# Patient Record
Sex: Female | Born: 1970 | Race: Black or African American | Hispanic: No | Marital: Married | Smoking: Never smoker
Health system: Southern US, Community
[De-identification: ages and names within clinical notes are randomized; demographics above are authoritative.]

## PROBLEM LIST (undated history)

## (undated) DIAGNOSIS — U071 COVID-19: Secondary | ICD-10-CM

## (undated) DIAGNOSIS — Z8249 Family history of ischemic heart disease and other diseases of the circulatory system: Secondary | ICD-10-CM

## (undated) DIAGNOSIS — R51 Headache: Secondary | ICD-10-CM

## (undated) DIAGNOSIS — R519 Headache, unspecified: Secondary | ICD-10-CM

## (undated) DIAGNOSIS — I10 Essential (primary) hypertension: Secondary | ICD-10-CM

## (undated) DIAGNOSIS — R002 Palpitations: Secondary | ICD-10-CM

## (undated) HISTORY — DX: Headache: R51

## (undated) HISTORY — PX: OTHER SURGICAL HISTORY: SHX169

## (undated) HISTORY — DX: Family history of ischemic heart disease and other diseases of the circulatory system: Z82.49

## (undated) HISTORY — DX: Palpitations: R00.2

## (undated) HISTORY — DX: Headache, unspecified: R51.9

---

## 2005-04-24 ENCOUNTER — Ambulatory Visit: Payer: Self-pay

## 2007-02-11 ENCOUNTER — Emergency Department: Payer: Self-pay | Admitting: Emergency Medicine

## 2007-02-26 ENCOUNTER — Emergency Department: Payer: Self-pay

## 2008-02-22 ENCOUNTER — Encounter: Payer: Self-pay | Admitting: Obstetrics and Gynecology

## 2008-02-22 ENCOUNTER — Ambulatory Visit: Payer: Self-pay | Admitting: Obstetrics and Gynecology

## 2008-03-14 ENCOUNTER — Ambulatory Visit: Payer: Self-pay | Admitting: Obstetrics and Gynecology

## 2010-03-01 ENCOUNTER — Ambulatory Visit: Payer: Self-pay | Admitting: Otolaryngology

## 2010-03-08 ENCOUNTER — Other Ambulatory Visit: Payer: Self-pay | Admitting: Otolaryngology

## 2010-05-10 ENCOUNTER — Ambulatory Visit: Payer: Self-pay | Admitting: Internal Medicine

## 2010-09-04 ENCOUNTER — Ambulatory Visit: Payer: Self-pay | Admitting: Family Medicine

## 2010-12-10 NOTE — Assessment & Plan Note (Signed)
Tara Newman, Tara Newman             ACCOUNT NO.:  [id number]   MEDICAL RECORD NO.:  [id number]          PATIENT TYPE:  POB   LOCATION:  [facility]         FACILITY:  [facility]   PHYSICIAN:  Argentina Donovan, MD        DATE OF BIRTH:  October 05, 1970   DATE OF SERVICE:  02/22/2008                                  CLINIC NOTE   The patient is a 40 year old African American female gravida 2, para 1-0-  1-1, child aged 55 years old, who had regular periods up until April  where she missed a period in April-May, had some spotting in June and  then had a heavy bleed lasting 2 weeks, and she was hemorrhaging in  July.  She went to Urgent Care, where they did a CMET on her and treated  her for hypertension.  She was put on hydrochlorothiazide, and today  when she comes in she has a blood pressure of 138/93; we will follow  that up.  She says she gained a lot of weight recently.  She weighs 269  pounds and is 5 feet 3 inches tall, and she also said that she had had  this clear, abundant, egg-white-type vaginal discharge.  We sat down  with her, and we discussed obesity and estrogen produced in the  peripheral tissues in obese women.  We talked about polycystic ovarian  syndrome, which apparently her daughter and 2 of her sisters have.  She  has no sign of hirsutism, but she does have at the present time the  onset of oligomenorrhea.  We did a Pap smear, and we are getting some  laboratory tests on her and have her come back in 2 weeks.  I have  explained to her what the problem is, and I am going to give her a  prescription for Provera to take if she goes more than 45 days without a  period.   On examination, the abdomen is soft, flat, and nontender; no masses, no  organomegaly.  No sign of female escutcheon.  External genitalia is  normal.  BUS is within normal limits.  Vagina is clean and well rugated.  Cervix clean and parous.  The uterus and adnexa could not be well  outlined because of habitus  of the patient.   IMPRESSION:  Oligomenorrhea, probably due to increased weight gain.           ______________________________  Argentina Donovan, MD     PR/MEDQ  D:  02/22/2008  T:  02/23/2008  Job:  [id number]

## 2011-03-09 ENCOUNTER — Emergency Department: Payer: Self-pay | Admitting: Internal Medicine

## 2011-09-10 ENCOUNTER — Ambulatory Visit: Payer: Self-pay | Admitting: Family Medicine

## 2013-07-04 ENCOUNTER — Ambulatory Visit: Payer: Self-pay

## 2014-12-13 ENCOUNTER — Emergency Department: Payer: PRIVATE HEALTH INSURANCE

## 2014-12-13 ENCOUNTER — Encounter: Payer: Self-pay | Admitting: Emergency Medicine

## 2014-12-13 ENCOUNTER — Emergency Department
Admission: EM | Admit: 2014-12-13 | Discharge: 2014-12-13 | Disposition: A | Discharge status: Home / Self Care | Payer: PRIVATE HEALTH INSURANCE | Attending: Student | Admitting: Student

## 2014-12-13 DIAGNOSIS — Y998 Other external cause status: Secondary | ICD-10-CM | POA: Insufficient documentation

## 2014-12-13 DIAGNOSIS — S4992XA Unspecified injury of left shoulder and upper arm, initial encounter: Secondary | ICD-10-CM | POA: Insufficient documentation

## 2014-12-13 DIAGNOSIS — I10 Essential (primary) hypertension: Secondary | ICD-10-CM | POA: Insufficient documentation

## 2014-12-13 DIAGNOSIS — S3992XA Unspecified injury of lower back, initial encounter: Secondary | ICD-10-CM | POA: Diagnosis present

## 2014-12-13 DIAGNOSIS — Y92410 Unspecified street and highway as the place of occurrence of the external cause: Secondary | ICD-10-CM | POA: Insufficient documentation

## 2014-12-13 DIAGNOSIS — S39012A Strain of muscle, fascia and tendon of lower back, initial encounter: Secondary | ICD-10-CM | POA: Insufficient documentation

## 2014-12-13 DIAGNOSIS — Z880 Allergy status to penicillin: Secondary | ICD-10-CM | POA: Insufficient documentation

## 2014-12-13 DIAGNOSIS — Y9389 Activity, other specified: Secondary | ICD-10-CM | POA: Insufficient documentation

## 2014-12-13 DIAGNOSIS — T148XXA Other injury of unspecified body region, initial encounter: Secondary | ICD-10-CM

## 2014-12-13 MED ORDER — KETOROLAC TROMETHAMINE 60 MG/2ML IM SOLN
60.0000 mg | Freq: Once | INTRAMUSCULAR | Status: AC
  Administered 2014-12-13: 60 mg via INTRAMUSCULAR

## 2014-12-13 MED ORDER — ORPHENADRINE CITRATE 30 MG/ML IJ SOLN
60.0000 mg | Freq: Two times a day (BID) | INTRAMUSCULAR | Status: DC
Start: 2014-12-13 — End: 2014-12-13
  Administered 2014-12-13: 60 mg via INTRAMUSCULAR

## 2014-12-13 MED ORDER — ORPHENADRINE CITRATE 30 MG/ML IJ SOLN
INTRAMUSCULAR | Status: AC
  Filled 2014-12-13: qty 2

## 2014-12-13 MED ORDER — IBUPROFEN 800 MG PO TABS
800.0000 mg | ORAL_TABLET | Freq: Three times a day (TID) | ORAL | Status: DC | PRN
Start: 2014-12-13 — End: 2016-08-25

## 2014-12-13 MED ORDER — CYCLOBENZAPRINE HCL 10 MG PO TABS: 10.0000 mg | ORAL_TABLET | Freq: Three times a day (TID) | ORAL | Status: DC | PRN

## 2014-12-13 MED ORDER — KETOROLAC TROMETHAMINE 60 MG/2ML IM SOLN
INTRAMUSCULAR | Status: AC
  Filled 2014-12-13: qty 2
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
## 2014-12-13 NOTE — Discharge Instructions (Signed)
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 Walk with an upright posture. Your ears, shoulders and hips should all line-up. PROLONGED ACTIVITY IN A FLEXED POSITION When completing a task that requires you to bend forward at your waist or lean over a low surface, try to find a way to stabilize 3 out of 4 of your limbs. You can place a hand or elbow on your thigh or rest a knee on the surface you are reaching across. This will provide you more stability, so that your muscles do not  tire as quickly. By keeping your knees relaxed, or slightly bent, you will also reduce stress across your lower back. CORRECT LIFTING TECHNIQUES DO :  Assume a wide stance. This will provide you more stability and the opportunity to get as close as possible to the object which you are lifting.  Tense your abdominals to brace your spine. Bend at the knees and hips. Keeping your back locked in a neutral-spine position, lift using your leg muscles. Lift with your legs, keeping your back straight.  Test the weight of unknown objects before attempting to  lift them.  Try to keep your elbows locked down at your sides in order get the best strength from your shoulders when carrying an object.  Always ask for help when lifting heavy or awkward objects. INCORRECT LIFTING TECHNIQUES DO NOT:   Lock your knees when lifting, even if it is a small object.  Bend and twist. Pivot at your feet or move your feet when needing to change directions.  Assume that you can safely pick up even a paperclip without proper posture. Document Released: 07/14/2005 Document Revised: 10/06/2011 Document Reviewed: 10/26/2008 [facility] Patient Information 2015 [employer], [place]. This information is not intended to replace advice given to you by your health care provider. Make sure you discuss any questions you have with your health care provider.
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
## 2016-08-19 ENCOUNTER — Emergency Department: Payer: PRIVATE HEALTH INSURANCE

## 2016-08-19 ENCOUNTER — Emergency Department
Admission: EM | Admit: 2016-08-19 | Discharge: 2016-08-20 | Disposition: A | Discharge status: Home / Self Care | Payer: PRIVATE HEALTH INSURANCE | Attending: Emergency Medicine | Admitting: Emergency Medicine

## 2016-08-19 DIAGNOSIS — Y939 Activity, unspecified: Secondary | ICD-10-CM | POA: Diagnosis not present

## 2016-08-19 DIAGNOSIS — Y929 Unspecified place or not applicable: Secondary | ICD-10-CM | POA: Diagnosis not present

## 2016-08-19 DIAGNOSIS — M549 Dorsalgia, unspecified: Secondary | ICD-10-CM

## 2016-08-19 DIAGNOSIS — S3992XA Unspecified injury of lower back, initial encounter: Secondary | ICD-10-CM | POA: Diagnosis present

## 2016-08-19 DIAGNOSIS — Y999 Unspecified external cause status: Secondary | ICD-10-CM | POA: Diagnosis not present

## 2016-08-19 DIAGNOSIS — R519 Headache, unspecified: Secondary | ICD-10-CM

## 2016-08-19 DIAGNOSIS — M542 Cervicalgia: Secondary | ICD-10-CM | POA: Insufficient documentation

## 2016-08-19 DIAGNOSIS — M5442 Lumbago with sciatica, left side: Secondary | ICD-10-CM | POA: Insufficient documentation

## 2016-08-19 DIAGNOSIS — I10 Essential (primary) hypertension: Secondary | ICD-10-CM | POA: Diagnosis not present

## 2016-08-19 DIAGNOSIS — W010XXA Fall on same level from slipping, tripping and stumbling without subsequent striking against object, initial encounter: Secondary | ICD-10-CM | POA: Diagnosis not present

## 2016-08-19 DIAGNOSIS — W009XXA Unspecified fall due to ice and snow, initial encounter: Secondary | ICD-10-CM

## 2016-08-19 DIAGNOSIS — R51 Headache: Secondary | ICD-10-CM

## 2016-08-19 LAB — POCT PREGNANCY, URINE: Preg Test, Ur: NEGATIVE

## 2016-08-19 MED ORDER — KETOROLAC TROMETHAMINE 10 MG PO TABS: 10.0000 mg | ORAL_TABLET | Freq: Three times a day (TID) | ORAL | 0 refills | Status: DC | PRN

## 2016-08-19 MED ORDER — KETOROLAC TROMETHAMINE 30 MG/ML IJ SOLN
60.0000 mg | Freq: Once | INTRAMUSCULAR | Status: AC
  Administered 2016-08-19: 60 mg via INTRAMUSCULAR
  Filled 2016-08-19: qty 2

## 2016-08-19 MED ORDER — OXYCODONE-ACETAMINOPHEN 5-325 MG PO TABS: 1.0000 | ORAL_TABLET | ORAL | 0 refills | Status: DC | PRN

## 2016-08-19 NOTE — ED Triage Notes (Signed)
Pt presents to ED with c/o neck and back pain d/t a slip and fall on ice yesterday. Pt unsure of head injury but reports head and neck pain on LEFT side; pt does deny any LOC. Pt denies any c/o numbness, weakness, or tingling in upper or lower extremities; pt denies any c/o loss of bowel or bladder control.

## 2016-08-19 NOTE — ED Provider Notes (Signed)
[facility] Emergency Department Provider Note  ____________________________________________  Time seen: Approximately 10:41 PM  I have reviewed the triage vital signs and the nursing notes.   HISTORY  Chief Complaint Fall; Neck Pain; and Back Pain    HPI Tara Newman is a 46 y.o. female with obesity presenting w/ L face, left lateral neck, mid and low back pain since slipping on ice yesterday at 6am.  Occasionally the patient gets radiation of the back pain into the left buttock and posterior thigh. No associated loss of consciousness. She was able to get up and ambulate immediately after the fall. She denies any headache or vomiting, visual changes,numbness tingling or weakness, saddle anesthesia, urinary or fecal incontinence or retention. Her pain was better with a warm shower. She has not tried any medications for pain.   History reviewed. No pertinent past medical history.  There are no active problems to display for this patient.   Past Surgical History:  Procedure Laterality Date  . broken ankle      Current Outpatient Rx  . Order #: [id number] Class: Print  . Order #: [id number] Class: Print  . Order #: [id number] Class: Print  . Order #: [id number] Class: Print    Allergies Penicillins  No family history on file.  Social History Social History  Substance Use Topics  . Smoking status: Never Smoker  . Smokeless tobacco: Never Used  . Alcohol use No    Review of Systems Constitutional: No fever/chills.Positive mechanical fall. Negative syncope. No loss of consciousness. Eyes: No visual changes. No blurred or double vision. ENT: No dental injury or malocclusion. No congestion or rhinorrhea. Positive for left face pain, mostly over the left zygomatic arch. Cardiovascular: Denies chest pain. Denies palpitations. Respiratory: Denies shortness of breath.  No cough. Gastrointestinal: No abdominal pain.  No nausea, no vomiting.  No diarrhea.   No constipation. Genitourinary: Negative for dysuria. Musculoskeletal: Positive for mid and lower back pain. Skin: Negative for rash. Neurological: Negative for headaches. No focal numbness, tingling or weakness. No difficulty walking.  10-point ROS otherwise negative.  ____________________________________________   PHYSICAL EXAM:  VITAL SIGNS: ED Triage Vitals  Enc Vitals Group     BP 08/19/16 1940 (!) 215/115     Pulse Rate 08/19/16 1940 83     Resp 08/19/16 1940 18     Temp 08/19/16 1940 98.2 F (36.8 C)     Temp Source 08/19/16 1940 Oral     SpO2 08/19/16 1940 100 %     Weight 08/19/16 1937 270 lb (122.5 kg)     Height 08/19/16 1937 5\' 4"  (1.626 m)     Head Circumference --      Peak Flow --      Pain Score 08/19/16 1937 8     Pain Loc --      Pain Edu? --      Excl. in GC? --     Constitutional: Alert and oriented. Well appearing and in no acute distress. Answers questions appropriately. Eyes: Conjunctivae are normal.  EOMI. No scleral icterus.No raccoon eyes. Head: Atraumatic. No Battle sign. No evidence of swelling or ecchymosis on the face. Nose: No congestion/rhinnorhea. No swelling over the nose or septal hematoma. Mouth/Throat: Mucous membranes are moist. No dental injury or malocclusion. Patient is able to open and close her mouth without any clicking or other abnormalities in the jaw. EARS: No hemotympanum in the left ear. Neck: No stridor.  Supple.  No midline C-spine tenderness to palpation, step-offs or  deformities. The patient does have some tenderness on the left lateral neck. Cardiovascular: Normal rate, Respiratory: Normal respiratory effort.  Musculoskeletal: Diffuse tenderness to palpation in the midline from the mid thoracic spine to the sacrum without focality. Neurologic:  A&Ox3.  Speech is clear.  Face and smile are symmetric.  EOMI.  Moves all extremities well. nromal gait without ataxia. Skin:  Skin is warm, dry and intact. No rash  noted. Psychiatric: Mood and affect are normal. Speech and behavior are normal.  Normal judgement.  ____________________________________________   LABS (all labs ordered are listed, but only abnormal results are displayed)  Labs Reviewed - No data to display ____________________________________________  EKG  Not indicated ____________________________________________  RADIOLOGY  No results found.  ____________________________________________   PROCEDURES  Procedure(s) performed: None  Procedures  Critical Care performed: No ____________________________________________   INITIAL IMPRESSION / ASSESSMENT AND PLAN / ED COURSE  Pertinent labs & imaging results that were available during my care of the patient were reviewed by me and considered in my medical decision making (see chart for details).  46 y.o. female with morbid obesity status post mechanical fall yesterday morning presenting with left face pain without any obvious injury, diffuse back pain that radiates to the left buttock consistent with contusion and possibly sciatica. There are no clinical symptoms that would be suggestive of spinal cord compression at this time. I will plan to treat the patient's pain with Toradol, x-ray her T and L-spine, and it is a discharge home with symptomatic treatment.  ____________________________________________  FINAL CLINICAL IMPRESSION(S) / ED DIAGNOSES  Final diagnoses:  Mid back pain  Acute midline low back pain with left-sided sciatica  Left-sided face pain  Neck pain on left side  Fall due to slipping on ice or snow, initial encounter         NEW MEDICATIONS STARTED DURING THIS VISIT:  New Prescriptions   KETOROLAC (TORADOL) 10 MG TABLET    Take 1 tablet (10 mg total) by mouth every 8 (eight) hours as needed for moderate pain (with food).   OXYCODONE-ACETAMINOPHEN (ROXICET) 5-325 MG TABLET    Take 1-2 tablets by mouth every 4 (four) hours as needed.       Rockne Menghini, MD 08/19/16 2252

## 2016-08-19 NOTE — Discharge Instructions (Addendum)
You may take Toradol, with food, for mild to moderate pain. Do not take the other NSAID medications including Aleve, ibuprofen, Motrin, or Advil your taking this medication. Percocet is for severe pain; do not drive within 8 hours of taking Percocet.  You may use an ice pack or heating pad to your face and back for 15 minutes every 2 hours.  Please have your doctor recheck your blood pressure.  Return to the emergency department if you develop severe pain, numbness or tingling, changes in bladder or bowel function, or any other symptoms concerning to you.

## 2016-08-20 MED ORDER — AMOXICILLIN-POT CLAVULANATE 875-125 MG PO TABS
ORAL_TABLET | ORAL | Status: AC
  Filled 2016-08-20: qty 1

## 2016-08-20 NOTE — ED Provider Notes (Signed)
-----------------------------------------   12:56 AM on 08/20/2016 -----------------------------------------   Blood pressure (!) 215/109, pulse 71, temperature 98.2 F (36.8 C), temperature source Oral, resp. rate 18, height 5\' 4"  (1.626 m), weight 270 lb (122.5 kg), last menstrual period 07/28/2016, SpO2 99 %.  Assuming care from Dr. Sharma CovertNorman.  In short, Tara Newman is a 46 y.o. female with a chief complaint of Fall; Neck Pain; and Back Pain .  Refer to the original H&P for additional details.  The current plan of care is to follow up the results of the patient's lumbar and thoracic spine x-rays.   Clinical Course as of Aug 20 54  Wed Aug 20, 2016  0055 No acute osseous abnormality of the lumbar spine. Mild joint space narrowing and sclerosis of the L4 through S1 facets.   DG Lumbar Spine Complete [AW]  0056 Chronic degenerative change of the thoracic spine without acute appearing fracture or bone destruction.   DG Thoracic Spine 2 View [AW]    Clinical Course User Index [AW] Rebecka ApleyAllison P Nichael Ehly, MD   The patient's imaging studies are unremarkable. She will be discharged to home.  The patient's blood pressure is chronically high as she stopped taking her medication and has not been following with her doctor. She reports that she will follow-up with her doctor. Chest pain no headache no shortness of breath or any other end organ symptoms.   Rebecka ApleyAllison P Haruo Stepanek, MD 08/20/16 0100

## 2016-08-25 ENCOUNTER — Ambulatory Visit (INDEPENDENT_AMBULATORY_CARE_PROVIDER_SITE_OTHER): Payer: PRIVATE HEALTH INSURANCE | Admitting: Family

## 2016-08-25 ENCOUNTER — Encounter: Payer: Self-pay | Admitting: Family

## 2016-08-25 ENCOUNTER — Encounter: Payer: Self-pay | Admitting: Emergency Medicine

## 2016-08-25 ENCOUNTER — Emergency Department: Payer: PRIVATE HEALTH INSURANCE

## 2016-08-25 ENCOUNTER — Emergency Department
Admission: EM | Admit: 2016-08-25 | Discharge: 2016-08-25 | Disposition: A | Discharge status: Home / Self Care | Payer: PRIVATE HEALTH INSURANCE | Attending: Student in an Organized Health Care Education/Training Program | Admitting: Student in an Organized Health Care Education/Training Program

## 2016-08-25 VITALS — BP 220/120 | HR 97 | Temp 98.4°F | Ht 63.5 in | Wt 279.2 lb

## 2016-08-25 DIAGNOSIS — R079 Chest pain, unspecified: Secondary | ICD-10-CM | POA: Diagnosis not present

## 2016-08-25 DIAGNOSIS — I10 Essential (primary) hypertension: Secondary | ICD-10-CM | POA: Insufficient documentation

## 2016-08-25 HISTORY — DX: Essential (primary) hypertension: I10

## 2016-08-25 LAB — BASIC METABOLIC PANEL
ANION GAP: 6 (ref 5–15)
BUN: 15 mg/dL (ref 6–20)
CALCIUM: 9.2 mg/dL (ref 8.9–10.3)
CO2: 26 mmol/L (ref 22–32)
Chloride: 102 mmol/L (ref 101–111)
Creatinine, Ser: 0.88 mg/dL (ref 0.44–1.00)
Glucose, Bld: 93 mg/dL (ref 65–99)
POTASSIUM: 4.4 mmol/L (ref 3.5–5.1)
SODIUM: 134 mmol/L — AB (ref 135–145)

## 2016-08-25 LAB — TROPONIN I

## 2016-08-25 MED ORDER — AMLODIPINE BESYLATE 5 MG PO TABS: 5.0000 mg | ORAL_TABLET | Freq: Every day | ORAL | 0 refills | Status: DC | PRN

## 2016-08-25 MED ORDER — HYDROCHLOROTHIAZIDE 25 MG PO TABS
25.0000 mg | ORAL_TABLET | Freq: Every day | ORAL | Status: DC
  Administered 2016-08-25: 25 mg via ORAL
  Filled 2016-08-25: qty 1

## 2016-08-25 MED ORDER — HYDROCHLOROTHIAZIDE 12.5 MG PO CAPS: 12.5000 mg | ORAL_CAPSULE | Freq: Every day | ORAL | 0 refills | Status: DC

## 2016-08-25 MED ORDER — AMLODIPINE BESYLATE 5 MG PO TABS
10.0000 mg | ORAL_TABLET | Freq: Once | ORAL | Status: AC
  Administered 2016-08-25: 10 mg via ORAL
  Filled 2016-08-25: qty 2

## 2016-08-25 NOTE — Assessment & Plan Note (Addendum)
No signs or symptoms. EKG NSR. When compared EKG 2012, no significant changes. A new patient to establish care today.  When retook blood pressure with thigh cuff,  220/120 . Very concerned for potential MI/CVA complicated by comorbidites, advised ER.Called report to nurse triage. Note: due to urgency of HTN, we will perform a full establish care appointment and history review at follow up.

## 2016-08-25 NOTE — ED Provider Notes (Signed)
[facility] Emergency Department Provider Note    First MD Initiated Contact with Patient 08/25/16 1527     (approximate)  I have reviewed the triage vital signs and the nursing notes.   HISTORY  Chief Complaint Hypertension    HPI Tara Newman is a 46 y.o. female with a history of hypertension previously on HCTZ now off that medication for several years presents from primary care clinic due to the severely elevated blood pressure. Patient otherwise asymptomatic and denies any symptoms or discomfort at this time.  States that she intermittently gets swelling in her legs. Has not been measuring her weight. Denies any chest pain or shortness of breath. No numbness or tingling.   Does have a past medical history of hypertension. No family history of bleeding disorders Past Surgical History:  Procedure Laterality Date  . broken ankle     Patient Active Problem List   Diagnosis Date Noted  . Hypertension 08/25/2016      Prior to Admission medications   Not on File    Allergies Penicillins    Social History Social History  Substance Use Topics  . Smoking status: Never Smoker  . Smokeless tobacco: Never Used  . Alcohol use No    Review of Systems Patient denies headaches, rhinorrhea, blurry vision, numbness, shortness of breath, chest pain, edema, cough, abdominal pain, nausea, vomiting, diarrhea, dysuria, fevers, rashes or hallucinations unless otherwise stated above in HPI. ____________________________________________   PHYSICAL EXAM:  VITAL SIGNS: Vitals:   08/25/16 1530 08/25/16 1615  BP: (!) 224/110 (!) 181/104  Pulse: 90 86  Resp: 18 18  Temp: 98.4 F (36.9 C)     Constitutional: Alert and oriented. Well appearing and in no acute distress. Eyes: Conjunctivae are normal. PERRL. EOMI. Head: Atraumatic. Nose: No congestion/rhinnorhea. Mouth/Throat: Mucous membranes are moist.  Oropharynx non-erythematous. Neck: No  stridor. Painless ROM. No cervical spine tenderness to palpation Hematological/Lymphatic/Immunilogical: No cervical lymphadenopathy. Cardiovascular: Normal rate, regular rhythm. Grossly normal heart sounds.  Good peripheral circulation. Respiratory: Normal respiratory effort.  No retractions. Lungs CTAB. Gastrointestinal: Soft and nontender. No distention. No abdominal bruits. No CVA tenderness. Genitourinary:  Musculoskeletal: No lower extremity tenderness nor edema.  No joint effusions. Neurologic:  Normal speech and language. No gross focal neurologic deficits are appreciated. No gait instability. Skin:  Skin is warm, dry and intact. No rash noted. Psychiatric: Mood and affect are normal. Speech and behavior are normal.  ____________________________________________   LABS (all labs ordered are listed, but only abnormal results are displayed)  Results for orders placed or performed during the hospital encounter of 08/25/16 (from the past 24 hour(s))  Troponin I     Status: None   Collection Time: 08/25/16  3:43 PM  Result Value Ref Range   Troponin I <0.03 <0.03 ng/mL  Basic metabolic panel     Status: Abnormal   Collection Time: 08/25/16  3:43 PM  Result Value Ref Range   Sodium 134 (L) 135 - 145 mmol/L   Potassium 4.4 3.5 - 5.1 mmol/L   Chloride 102 101 - 111 mmol/L   CO2 26 22 - 32 mmol/L   Glucose, Bld 93 65 - 99 mg/dL   BUN 15 6 - 20 mg/dL   Creatinine, Ser 1.61 0.44 - 1.00 mg/dL   Calcium 9.2 8.9 - 09.6 mg/dL   GFR calc non Af Amer >60 >60 mL/min   GFR calc Af Amer >60 >60 mL/min   Anion gap 6 5 - 15  ____________________________________________  EKG My review and personal interpretation at Time: 14"34   Indication: htn  Rate: 90  Rhythm: sinus Axis: normal Other: no STEMI, non specific st changes, no significant depressions ____________________________________________  RADIOLOGY  I personally reviewed all radiographic images ordered to evaluate for the above  acute complaints and reviewed radiology reports and findings.  These findings were personally discussed with the patient.  Please see medical record for radiology report.  ____________________________________________   PROCEDURES  Procedure(s) performed:  Procedures    Critical Care performed: no ____________________________________________   INITIAL IMPRESSION / ASSESSMENT AND PLAN / ED COURSE  Pertinent labs & imaging results that were available during my care of the patient were reviewed by me and considered in my medical decision making (see chart for details).  DDX: htn urgency, htn emergency, asc, chf, ras  Tara Newman is a 46 y.o. who presents to the ED with elevated blood pressure. Patient is very pleasant and well-appearing. She is not demonstrating any acute neuro deficits at this time. Do not believe this represents a stroke. Patient does have a history of hypertension for she was taking a blood pressure medications in the past. Not currently on any. She denies any chest pain or pressure. I do believe that she does need emergent evaluation of any possible and organ damage therefore we will check blood work, EKG, chest x-ray and troponin.  We will give oral dose of Norvasc.  Records it does appear the patient has chronic hypertension therefore I do not want to aggressively lower the blood pressure at this time.  Clinical Course as of Aug 26 2055  Mon Aug 25, 2016  1716 Troponin is negative. EKG without any ischemic changes. Renal function is normal. No evidence of acute end organ damage. Will add on HCTZ   Blood pressure still elevated but downtrending particularly her diastolics. She has no hypoxia and remains hemodynamic stable I had a long conversation with her regarding appropriate management of blood pressure and what to expect over the next few days. She is very established care with the primary care clinic. She has excellent outpatient resources. She has no signs of  end organ damage and will start the patient on HCTZ with when necessary Norvasc.  Patient was able to tolerate PO and was able to ambulate with a steady gait.  Have discussed with the patient and available family all diagnostics and treatments performed thus far and all questions were answered to the best of my ability. The patient demonstrates understanding and agreement with plan.  [PR]    Clinical Course User Index [PR] Willy EddyPatrick Delon Revelo, MD     ____________________________________________   FINAL CLINICAL IMPRESSION(S) / ED DIAGNOSES  Final diagnoses:  Hypertension, unspecified type      NEW MEDICATIONS STARTED DURING THIS VISIT:  New Prescriptions   No medications on file     Note:  This document was prepared using Dragon voice recognition software and may include unintentional dictation errors.    Willy EddyPatrick Giani Betzold, MD 08/25/16 2059

## 2016-08-25 NOTE — Patient Instructions (Signed)
Advised patient to go immediately to closest emergency department. Patient and I agreed with this plan due to urgent nature of symptoms.     

## 2016-08-25 NOTE — Progress Notes (Signed)
Subjective:    Patient ID: Tara Newman, female    DOB: 01-09-1971, 46 y.o.   MRN: [id number]  CC: Tara Newman is a 46 y.o. female who presents today to establish care.    HPI: No formal diagnosis of HTN. Years ago, had been checked bp at work, highest SBP 190 in which she started HCTZ 'years ago.'  Endorses 'chest tightness' gradually vision worsening   Denies numbness or tingling radiating to left arm or jaw, palpitations, dizziness, frequent headaches, changes in vision, or shortness of breath.         ER 1/23 after fall; No head CT done. DG lumbar, thoracic no acute findings, degenerative changes  HISTORY:  No past medical history on file. Past Surgical History:  Procedure Laterality Date  . broken ankle     No family history on file.  Allergies: Penicillins No current outpatient prescriptions on file prior to visit.   No current facility-administered medications on file prior to visit.     Social History  Substance Use Topics  . Smoking status: Never Smoker  . Smokeless tobacco: Never Used  . Alcohol use No    Review of Systems  Constitutional: Negative for chills and fever.  Eyes: Negative for visual disturbance.  Respiratory: Negative for cough, shortness of breath and wheezing.   Cardiovascular: Negative for chest pain and palpitations.  Gastrointestinal: Negative for nausea and vomiting.  Neurological: Negative for headaches.      Objective:    BP (!) 220/120 Comment: thigh cuff  Pulse 97   Temp 98.4 F (36.9 C) (Oral)   Ht 5' 3.5" (1.613 m)   Wt 279 lb 3.2 oz (126.6 kg)   LMP 07/28/2016 (Approximate)   SpO2 95%   BMI 48.68 kg/m  BP Readings from Last 3 Encounters:  08/25/16 (!) 220/120  08/20/16 (!) 215/109  12/13/14 (!) 160/89   Wt Readings from Last 3 Encounters:  08/25/16 279 lb 3.2 oz (126.6 kg)  08/19/16 270 lb (122.5 kg)  12/13/14 275 lb (124.7 kg)    Physical Exam  Constitutional: She appears well-developed and  well-nourished.  HENT:  Mouth/Throat: Uvula is midline, oropharynx is clear and moist and mucous membranes are normal.  Eyes: Conjunctivae and EOM are normal. Pupils are equal, round, and reactive to light.  Fundus normal bilaterally.   Cardiovascular: Normal rate, regular rhythm, normal heart sounds and normal pulses.   Pulmonary/Chest: Effort normal and breath sounds normal. She has no wheezes. She has no rhonchi. She has no rales.  Neurological: She is alert. She has normal strength. No cranial nerve deficit or sensory deficit. She displays a negative Romberg sign.  Reflex Scores:      Bicep reflexes are 2+ on the right side and 2+ on the left side.      Patellar reflexes are 2+ on the right side and 2+ on the left side. Grip equal and strong bilateral upper extremities. Gait strong and steady. Able to perform rapid alternating movement without difficulty.   Skin: Skin is warm and dry.  Psychiatric: She has a normal mood and affect. Her speech is normal and behavior is normal. Thought content normal.  Vitals reviewed.      Assessment & Plan:   Problem List Items Addressed This Visit      Cardiovascular and Mediastinum   Hypertension - Primary    No signs or symptoms. EKG NSR. When compared EKG 2012, no significant changes. A new patient to establish care today.  When retook blood pressure with thigh cuff,  220/120 . Very concerned for potential MI/CVA complicated by comorbidites, advised ER.Called report to nurse triage. Note: due to urgency of HTN, we will perform a full establish care appointment and history review at follow up.        Other Visit Diagnoses    Chest pain, unspecified type       Relevant Orders   EKG 12-Lead (Completed)       I have discontinued Ms. Yore's ibuprofen, cyclobenzaprine, oxyCODONE-acetaminophen, and ketorolac.   No orders of the defined types were placed in this encounter.   Return precautions given.   Risks, benefits, and  alternatives of the medications and treatment plan prescribed today were discussed, and patient expressed understanding.   Education regarding symptom management and diagnosis given to patient on AVS.  Continue to follow with Rennie PlowmanMargaret Kimblery Diop, FNP for routine health maintenance.   Tereso Newcomeronna K Bastone and I agreed with plan.   Rennie PlowmanMargaret Kalijah Westfall, FNP

## 2016-08-25 NOTE — Progress Notes (Signed)
Pre visit review using our clinic review tool, if applicable. No additional management support is needed unless otherwise documented below in the visit note. 

## 2016-08-25 NOTE — ED Triage Notes (Signed)
Patient was seen on 08/19/16 here for fall on ice. Was noted to have high blood pressure that day. Has family history of HTN, but none personally except last ER visit. Patient denies any symtpoms (denies HA, chest pain, shortness of breath, weakness, tingling, or blurred vision). Patient was sent by EMS from PCP office Corinda Gubler([place] [facility]).

## 2016-08-26 ENCOUNTER — Ambulatory Visit (INDEPENDENT_AMBULATORY_CARE_PROVIDER_SITE_OTHER): Payer: PRIVATE HEALTH INSURANCE | Admitting: Family

## 2016-08-26 ENCOUNTER — Encounter: Payer: Self-pay | Admitting: Family

## 2016-08-26 VITALS — BP 192/102 | HR 80 | Temp 97.9°F | Ht 63.5 in | Wt 272.6 lb

## 2016-08-26 DIAGNOSIS — I10 Essential (primary) hypertension: Secondary | ICD-10-CM | POA: Diagnosis not present

## 2016-08-26 MED ORDER — HYDROCHLOROTHIAZIDE 25 MG PO TABS: 25.0000 mg | ORAL_TABLET | Freq: Every day | ORAL | 0 refills | Status: DC

## 2016-08-26 MED ORDER — AMLODIPINE BESYLATE 5 MG PO TABS: 5.0000 mg | ORAL_TABLET | Freq: Every day | ORAL | 0 refills | Status: DC

## 2016-08-26 NOTE — Assessment & Plan Note (Addendum)
Elevated. No signs, symptoms of HTN urgency, emergency at this time. Reassured by normal neurologic exam. Increased HCTZ from 12.5 to 25. However advised patient that she may increase at her discretion and I do not want to drop her BP too fast and we may not be seeing the full effects of medication after one day. Amlodipine QD. BP log and she will call with readings this week. Advised ED if HA worsens or new symptoms. follow up next week.

## 2016-08-26 NOTE — Patient Instructions (Signed)
Increase HCTZ to 25mg   Take amlodipine daily  BP log for next couple of days and call with readings  Follow up next week.

## 2016-08-26 NOTE — Progress Notes (Signed)
Subjective:    Patient ID: Tara Newman, female    DOB: 1970/09/24, 46 y.o.   MRN: [id number]  CC: Tara Newman is a 46 y.o. female who presents today for follow up.   HPI: Patient was here yesterday to establish care however, visit was abruptly ended when her blood pressure was over 200 and she was sent to the emergency room. she was seen in emergency room and had a benign worku,  negative troponins and normal EKG. She was started on amlodipine and hydrochlorothiazide which she took this morning. BP at home 178/92  and then 205 just prior to visit.  Does describe a mild 'nagging' HA which she didn't have yesterday and  Usually has when her menstrual cycle is starting. 'Not worse HA of her life.'   Denies exertional chest pain or pressure, numbness or tingling radiating to left arm or jaw, palpitations, dizziness, changes in vision, or shortness of breath.          HISTORY:  Past Medical History:  Diagnosis Date  . Frequent headaches   . Hypertension    Past Surgical History:  Procedure Laterality Date  . broken ankle     Family History  Problem Relation Age of Onset  . Arthritis Mother   . Heart disease Mother   . Hypertension Mother   . Diabetes Mother   . Arthritis Father   . Heart disease Father   . Hyperlipidemia Sister   . Cancer Maternal Aunt   . Kidney disease Maternal Aunt   . Cancer Paternal Aunt   . Cancer Paternal Grandfather     Allergies: Penicillins No current outpatient prescriptions on file prior to visit.   No current facility-administered medications on file prior to visit.     Social History  Substance Use Topics  . Smoking status: Never Smoker  . Smokeless tobacco: Never Used  . Alcohol use No    Review of Systems  Constitutional: Negative for chills and fever.  Eyes: Negative for visual disturbance.  Respiratory: Negative for cough.   Cardiovascular: Negative for chest pain and palpitations.  Gastrointestinal: Negative for  nausea and vomiting.  Neurological: Positive for headaches.      Objective:    BP (!) 192/102   Pulse 80   Temp 97.9 F (36.6 C) (Oral)   Ht 5' 3.5" (1.613 m)   Wt 272 lb 9.6 oz (123.7 kg)   LMP 07/28/2016 (Approximate)   SpO2 98%   BMI 47.53 kg/m  BP Readings from Last 3 Encounters:  08/27/16 (!) 192/102  08/25/16 (!) 204/94  08/25/16 (!) 220/120   Wt Readings from Last 3 Encounters:  08/26/16 272 lb 9.6 oz (123.7 kg)  08/25/16 279 lb (126.6 kg)  08/25/16 279 lb 3.2 oz (126.6 kg)    Physical Exam  Constitutional: She appears well-developed and well-nourished.  HENT:  Mouth/Throat: Uvula is midline, oropharynx is clear and moist and mucous membranes are normal.  Eyes: Conjunctivae and EOM are normal. Pupils are equal, round, and reactive to light.  Fundus normal bilaterally.   Cardiovascular: Normal rate, regular rhythm, normal heart sounds and normal pulses.   Pulmonary/Chest: Effort normal and breath sounds normal. She has no wheezes. She has no rhonchi. She has no rales.  Neurological: She is alert. She has normal strength. No cranial nerve deficit or sensory deficit. She displays a negative Romberg sign.  Reflex Scores:      Bicep reflexes are 2+ on the right side and 2+ on  the left side.      Patellar reflexes are 2+ on the right side and 2+ on the left side. Grip equal and strong bilateral upper extremities. Gait strong and steady. Able to perform rapid alternating movement without difficulty.   Skin: Skin is warm and dry.  Psychiatric: She has a normal mood and affect. Her speech is normal and behavior is normal. Thought content normal.  Vitals reviewed.      Assessment & Plan:   Problem List Items Addressed This Visit      Cardiovascular and Mediastinum   Hypertension - Primary    Elevated. No signs, symptoms of HTN urgency, emergency at this time. Reassured by normal neurologic exam. Increased HCTZ from 12.5 to 25. However advised patient that she may  increase at her discretion and I do not want to drop her BP too fast and we may not be seeing the full effects of medication after one day. Amlodipine QD. BP log and she will call with readings this week. Advised ED if HA worsens or new symptoms. follow up next week.      Relevant Medications   hydrochlorothiazide (HYDRODIURIL) 25 MG tablet   amLODipine (NORVASC) 5 MG tablet       I have discontinued [name]'s hydrochlorothiazide. I have also changed her amLODipine. Additionally, I am having her start on hydrochlorothiazide.   Meds ordered this encounter  Medications  . hydrochlorothiazide (HYDRODIURIL) 25 MG tablet    Sig: Take 1 tablet (25 mg total) by mouth daily.    Dispense:  90 tablet    Refill:  0    Order Specific Question:   Supervising Provider    Answer:   Duncan DullULLO, TERESA L [[id number]]  . amLODipine (NORVASC) 5 MG tablet    Sig: Take 1 tablet (5 mg total) by mouth daily.    Dispense:  90 tablet    Refill:  0    Order Specific Question:   Supervising Provider    Answer:   Sherlene ShamsULLO, TERESA L [[id number]]    Return precautions given.   Risks, benefits, and alternatives of the medications and treatment plan prescribed today were discussed, and patient expressed understanding.   Education regarding symptom management and diagnosis given to patient on AVS.  Continue to follow with Tara PlowmanMargaret Arnett, FNP for routine health maintenance.   Tereso Newcomeronna K Newman and I agreed with plan.   Tara PlowmanMargaret Arnett, FNP

## 2016-08-26 NOTE — Progress Notes (Signed)
Pre visit review using our clinic review tool, if applicable. No additional management support is needed unless otherwise documented below in the visit note. 

## 2016-08-27 ENCOUNTER — Encounter: Payer: Self-pay | Admitting: Family

## 2016-09-17 ENCOUNTER — Ambulatory Visit (INDEPENDENT_AMBULATORY_CARE_PROVIDER_SITE_OTHER): Payer: PRIVATE HEALTH INSURANCE | Admitting: Family

## 2016-09-17 ENCOUNTER — Encounter: Payer: Self-pay | Admitting: Family

## 2016-09-17 VITALS — BP 156/94 | HR 94 | Temp 98.2°F | Wt 270.8 lb

## 2016-09-17 DIAGNOSIS — R1310 Dysphagia, unspecified: Secondary | ICD-10-CM | POA: Insufficient documentation

## 2016-09-17 DIAGNOSIS — I10 Essential (primary) hypertension: Secondary | ICD-10-CM

## 2016-09-17 LAB — CBC WITH DIFFERENTIAL/PLATELET
BASOS PCT: 0.7 % (ref 0.0–3.0)
Basophils Absolute: 0 10*3/uL (ref 0.0–0.1)
EOS ABS: 0.1 10*3/uL (ref 0.0–0.7)
EOS PCT: 2.5 % (ref 0.0–5.0)
HEMATOCRIT: 36.9 % (ref 36.0–46.0)
Hemoglobin: 12.3 g/dL (ref 12.0–15.0)
LYMPHS PCT: 39.5 % (ref 12.0–46.0)
Lymphs Abs: 1.9 10*3/uL (ref 0.7–4.0)
MCHC: 33.5 g/dL (ref 30.0–36.0)
MCV: 83.7 fl (ref 78.0–100.0)
MONO ABS: 0.5 10*3/uL (ref 0.1–1.0)
Monocytes Relative: 9.3 % (ref 3.0–12.0)
Neutro Abs: 2.3 10*3/uL (ref 1.4–7.7)
Neutrophils Relative %: 48 % (ref 43.0–77.0)
Platelets: 335 10*3/uL (ref 150.0–400.0)
RBC: 4.4 Mil/uL (ref 3.87–5.11)
RDW: 15.4 % (ref 11.5–15.5)
WBC: 4.8 10*3/uL (ref 4.0–10.5)

## 2016-09-17 LAB — COMPREHENSIVE METABOLIC PANEL
ALBUMIN: 4.4 g/dL (ref 3.5–5.2)
ALT: 14 U/L (ref 0–35)
AST: 16 U/L (ref 0–37)
Alkaline Phosphatase: 59 U/L (ref 39–117)
BUN: 17 mg/dL (ref 6–23)
CALCIUM: 9.3 mg/dL (ref 8.4–10.5)
CO2: 33 mEq/L — ABNORMAL HIGH (ref 19–32)
CREATININE: 0.79 mg/dL (ref 0.40–1.20)
Chloride: 95 mEq/L — ABNORMAL LOW (ref 96–112)
GFR: 100.59 mL/min (ref >60.00)
Glucose, Bld: 87 mg/dL (ref 70–99)
Potassium: 3 mEq/L — ABNORMAL LOW (ref 3.5–5.1)
SODIUM: 136 meq/L (ref 135–145)
TOTAL PROTEIN: 7.4 g/dL (ref 6.0–8.3)
Total Bilirubin: 0.6 mg/dL (ref 0.2–1.2)

## 2016-09-17 LAB — LIPID PANEL
CHOLESTEROL: 243 mg/dL — AB (ref 0–200)
HDL: 46.6 mg/dL (ref >39.00)
LDL Cholesterol: 169 mg/dL — ABNORMAL HIGH (ref 0–99)
NONHDL: 196.85
Total CHOL/HDL Ratio: 5
Triglycerides: 138 mg/dL (ref 0.0–149.0)
VLDL: 27.6 mg/dL (ref 0.0–40.0)

## 2016-09-17 LAB — TSH: TSH: 2.27 u[IU]/mL (ref 0.35–4.50)

## 2016-09-17 LAB — VITAMIN D 25 HYDROXY (VIT D DEFICIENCY, FRACTURES): VITD: 6.2 ng/mL — ABNORMAL LOW (ref 30.00–100.00)

## 2016-09-17 LAB — HEMOGLOBIN A1C: HEMOGLOBIN A1C: 6.1 % (ref 4.6–6.5)

## 2016-09-17 MED ORDER — HYDROCHLOROTHIAZIDE 50 MG PO TABS: 50.0000 mg | ORAL_TABLET | Freq: Every day | ORAL | 0 refills | Status: DC

## 2016-09-17 NOTE — Assessment & Plan Note (Addendum)
Uncontrolled. Increased HCTZ. No signs or symptoms of hypertensive urgency or emergency at this time. Very concerned with episode of left arm numbness which resolved on aspirin. Thorough neurologic exam done today, no deficits. Education provided on prevention of stroke in the context of her uncontrolled blood pressure at this time. Advised patient if symptom recurs, she must go to emergency room. At this time, we jointly agreed she would stay on low-dose aspirin. Follow up one week

## 2016-09-17 NOTE — Patient Instructions (Addendum)
Start 81 mg aspirin every day. Please watch for bleeding.  If any more arm numbness, please call 911 as concerned for stroke  Follow up one week  Labs today  Referral to gi   Managing Your Hypertension Hypertension is commonly called high blood pressure. Blood pressure is a measurement of how strongly your blood is pressing against the walls of your arteries. Arteries are blood vessels that carry blood from your heart throughout your body. Blood pressure does not stay the same. It rises when you are active, excited, or nervous. It lowers when you are sleeping or relaxed. If the numbers that measure your blood pressure stay above normal most of the time, you are at risk for health problems. Hypertension is a long-term (chronic) condition in which blood pressure is elevated. This condition often has no signs or symptoms. The cause of the condition is usually not known. What are blood pressure readings? A blood pressure reading is recorded as two numbers, such as "120 over 80" (or 120/80). The first ("top") number is called the systolic pressure. It is a measure of the pressure in your arteries as the heart beats. The second ("bottom") number is called the diastolic pressure. It is a measure of the pressure in your arteries as the heart relaxes between beats. What does my blood pressure reading mean? Blood pressure is classified into four stages. Based on your blood pressure reading, your health care provider may use the following stages to determine what type of treatment, if any, is needed. Systolic pressure and diastolic pressure are measured in a unit called mm Hg. Normal  Systolic pressure: below 120.  Diastolic pressure: below 80. Prehypertension  Systolic pressure: 120-139.  Diastolic pressure: 80-89. Hypertension stage 1  Systolic pressure: 140-159.  Diastolic pressure: 90-99. Hypertension stage 2  Systolic pressure: 160 or above.  Diastolic pressure: 100 or above. What  health risks are associated with hypertension? Managing your hypertension is an important responsibility. Uncontrolled hypertension can lead to:  A heart attack.  A stroke.  A weakened blood vessel (aneurysm).  Heart failure.  Kidney damage.  Eye damage.  Metabolic syndrome.  Memory and concentration problems. What changes can I make to manage my hypertension? Hypertension can be managed effectively by making lifestyle changes and possibly by taking medicines. Your health care provider will help you come up with a plan to bring your blood pressure within a normal range. Your plan should include the following: Monitoring  Monitor your blood pressure at home as told by your health care provider. Your personal target blood pressure may vary depending on your medical conditions, your age, and other factors.  Have your blood pressure rechecked as told by your health care provider. Lifestyle  Lose weight if necessary.  Get at least 30-45 minutes of aerobic exercise at least 4 times a week.  Do not use any products that contain nicotine or tobacco, such as cigarettes and e-cigarettes. If you need help quitting, ask your health care provider.  Learn ways to reduce stress.  Control any chronic conditions, such as high cholesterol or diabetes. Eating and drinking  Follow the DASH diet. This diet is high in fruits, vegetables, and whole grains. It is low in salt, red meat, and added sugars.  Keep your sodium intake below 2,300 mg per day.  Limit alcoholic beverages. Communication  Review all the medicines you take with your health care provider because there may be side effects or interactions.  Talk with your health care provider about  your diet, exercise habits, and other lifestyle factors that may be contributing to hypertension.  See your health care provider regularly. Your health care provider can help you create and adjust your plan for managing hypertension. Will I need  medicine to control my blood pressure? Your health care provider may prescribe medicine if lifestyle changes are not enough to get your blood pressure under control, and if one of the following is true:  You are 47-63 years of age, and your systolic blood pressure is 140 or higher.  You are 2 years of age or older, and your systolic blood pressure is 150 or higher.  Your diastolic blood pressure is 90 or higher.  You have diabetes, and your systolic blood pressure is over 140 or your diastolic blood pressure is over 90.  You have kidney disease, and your blood pressure is above 140/90.  You have heart disease or a history of stroke, and your blood pressure is 140/90 or higher. Take medicines only as told by your health care provider. Follow the directions carefully. Blood pressure medicines must be taken as prescribed. The medicine does not work as well when you skip doses. Skipping doses also puts you at risk for problems. Contact a health care provider if:  You think you are having a reaction to medicines you have taken.  You have repeated (recurrent) headaches.  You feel dizzy.  You have swelling in your ankles.  You have trouble with your vision. Get help right away if:  You develop a severe headache or confusion.  You have unusual weakness or numbness, or you feel faint.  You have severe pain in your chest or abdomen.  You vomit repeatedly.  You have trouble breathing. This information is not intended to replace advice given to you by your health care provider. Make sure you discuss any questions you have with your health care provider. Document Released: 04/07/2012 Document Revised: 03/18/2016 Document Reviewed: 10/12/2015 Elsevier Interactive Patient Education  2017 [employer].

## 2016-09-17 NOTE — Progress Notes (Signed)
Subjective:    Patient ID: Tara Newman, female    DOB: 09-17-1970, 46 y.o.   MRN: [id number]  CC: Tara Newman is a 46 y.o. female who presents today for follow up.   HPI: HTN-  BP at home at been running 148/83. On amlodipine and hctz  Denies exertional chest pain or pressure,  palpitations, dizziness, frequent headaches, changes in vision, or shortness of breath.   Had an episode of feeling left arm tingling and palpitations. Took ASA, and feeling went away in a few minutes.    She also complains of chronic difficulty swallowing liquids. 'feels something stuck.' No thyroid disease, hoarseness. No acid reflux.       HISTORY:  Past Medical History:  Diagnosis Date  . Frequent headaches   . Hypertension    Past Surgical History:  Procedure Laterality Date  . broken ankle     Family History  Problem Relation Age of Onset  . Arthritis Mother   . Hypertension Mother   . Diabetes Mother   . Arthritis Father   . Heart disease Father   . Hyperlipidemia Sister   . Kidney disease Maternal Aunt   . Breast cancer Maternal Aunt   . Thyroid cancer Maternal Aunt   . Breast cancer Paternal Aunt 74  . Prostate cancer Paternal Grandfather   . Aneurysm Other     Allergies: Penicillins Current Outpatient Prescriptions on File Prior to Visit  Medication Sig Dispense Refill  . amLODipine (NORVASC) 5 MG tablet Take 1 tablet (5 mg total) by mouth daily. 90 tablet 0   No current facility-administered medications on file prior to visit.     Social History  Substance Use Topics  . Smoking status: Never Smoker  . Smokeless tobacco: Never Used  . Alcohol use No    Review of Systems  Constitutional: Negative for chills and fever.  HENT: Positive for trouble swallowing. Negative for congestion and voice change.   Eyes: Negative for visual disturbance.  Respiratory: Negative for cough, shortness of breath and wheezing.   Cardiovascular: Negative for chest pain and  palpitations.  Gastrointestinal: Negative for nausea and vomiting.  Neurological: Positive for numbness (resolved). Negative for dizziness.      Objective:    BP (!) 156/94 (BP Location: Right Arm, Patient Position: Sitting, Cuff Size: Large)   Pulse 94   Temp 98.2 F (36.8 C) (Oral)   Wt 270 lb 12.8 oz (122.8 kg)   SpO2 97%   BMI 47.22 kg/m  BP Readings from Last 3 Encounters:  09/17/16 (!) 156/94  08/27/16 (!) 192/102  08/25/16 (!) 204/94   Wt Readings from Last 3 Encounters:  09/17/16 270 lb 12.8 oz (122.8 kg)  08/26/16 272 lb 9.6 oz (123.7 kg)  08/25/16 279 lb (126.6 kg)    Physical Exam  Constitutional: She appears well-developed and well-nourished.  HENT:  Mouth/Throat: Uvula is midline, oropharynx is clear and moist and mucous membranes are normal. No oropharyngeal exudate, posterior oropharyngeal edema or posterior oropharyngeal erythema.  Eyes: Conjunctivae and EOM are normal. Pupils are equal, round, and reactive to light.  Fundus normal bilaterally.   Neck: No thyroid mass and no thyromegaly present.  Cardiovascular: Normal rate, regular rhythm, normal heart sounds and normal pulses.   Pulmonary/Chest: Effort normal and breath sounds normal. She has no wheezes. She has no rhonchi. She has no rales.  Neurological: She is alert. She has normal strength. No cranial nerve deficit or sensory deficit. She displays a negative  Romberg sign.  Reflex Scores:      Bicep reflexes are 2+ on the right side and 2+ on the left side.      Patellar reflexes are 2+ on the right side and 2+ on the left side. Grip equal and strong bilateral upper extremities. Gait strong and steady. Able to perform rapid alternating movement without difficulty.   Skin: Skin is warm and dry.  Psychiatric: She has a normal mood and affect. Her speech is normal and behavior is normal. Thought content normal.  Vitals reviewed.      Assessment & Plan:   Problem List Items Addressed This Visit       Cardiovascular and Mediastinum   Hypertension - Primary    Uncontrolled. Increased HCTZ. No signs or symptoms of hypertensive urgency or emergency at this time. Very concerned with episode of left arm numbness which resolved on aspirin. Thorough neurologic exam done today, no deficits. Education provided on prevention of stroke in the context of her uncontrolled blood pressure at this time. Advised patient if symptom recurs, she must go to emergency room. At this time, we jointly agreed she would stay on low-dose aspirin. Follow up one week      Relevant Medications   hydrochlorothiazide (HYDRODIURIL) 50 MG tablet   Other Relevant Orders   Comprehensive metabolic panel   CBC with Differential/Platelet   Comprehensive metabolic panel   Hemoglobin A1c   Lipid panel   TSH   VITAMIN D 25 Hydroxy (Vit-D Deficiency, Fractures)     Digestive   Dysphagia    New. No hoarseness, cough to suggest GERD. Chronic for pateint. Pending GI referral      Relevant Orders   Ambulatory referral to Gastroenterology       I have changed Tara Newman's hydrochlorothiazide. I am also having her maintain her amLODipine.   Meds ordered this encounter  Medications  . hydrochlorothiazide (HYDRODIURIL) 50 MG tablet    Sig: Take 1 tablet (50 mg total) by mouth daily.    Dispense:  90 tablet    Refill:  0    Order Specific Question:   Supervising Provider    Answer:   Sherlene ShamsULLO, TERESA L [[id number]]    Return precautions given.   Risks, benefits, and alternatives of the medications and treatment plan prescribed today were discussed, and patient expressed understanding.   Education regarding symptom management and diagnosis given to patient on AVS.  Continue to follow with Tara PlowmanMargaret Marji Kuehnel, FNP for routine health maintenance.   Tara Newman and I agreed with plan.   Tara PlowmanMargaret Iden Stripling, FNP

## 2016-09-17 NOTE — Progress Notes (Signed)
Pre visit review using our clinic review tool, if applicable. No additional management support is needed unless otherwise documented below in the visit note. 

## 2016-09-17 NOTE — Assessment & Plan Note (Addendum)
New. No hoarseness, cough to suggest GERD. Chronic for pateint. Pending GI referral

## 2016-09-22 ENCOUNTER — Ambulatory Visit: Payer: PRIVATE HEALTH INSURANCE | Admitting: Family

## 2016-09-22 ENCOUNTER — Encounter: Payer: Self-pay | Admitting: Internal Medicine

## 2016-09-22 ENCOUNTER — Telehealth: Payer: Self-pay | Admitting: Family

## 2016-09-22 ENCOUNTER — Telehealth: Payer: Self-pay

## 2016-09-22 ENCOUNTER — Ambulatory Visit (INDEPENDENT_AMBULATORY_CARE_PROVIDER_SITE_OTHER): Payer: PRIVATE HEALTH INSURANCE | Admitting: Internal Medicine

## 2016-09-22 VITALS — BP 120/90 | HR 80 | Ht 63.5 in | Wt 269.1 lb

## 2016-09-22 DIAGNOSIS — M542 Cervicalgia: Secondary | ICD-10-CM | POA: Diagnosis not present

## 2016-09-22 DIAGNOSIS — F458 Other somatoform disorders: Secondary | ICD-10-CM | POA: Diagnosis not present

## 2016-09-22 DIAGNOSIS — E876 Hypokalemia: Secondary | ICD-10-CM

## 2016-09-22 DIAGNOSIS — R1310 Dysphagia, unspecified: Secondary | ICD-10-CM | POA: Diagnosis not present

## 2016-09-22 DIAGNOSIS — R0989 Other specified symptoms and signs involving the circulatory and respiratory systems: Secondary | ICD-10-CM

## 2016-09-22 DIAGNOSIS — I10 Essential (primary) hypertension: Secondary | ICD-10-CM

## 2016-09-22 MED ORDER — POTASSIUM CHLORIDE CRYS ER 10 MEQ PO TBCR: 20.0000 meq | EXTENDED_RELEASE_TABLET | Freq: Two times a day (BID) | ORAL | 0 refills | Status: DC

## 2016-09-22 MED ORDER — HYDROCHLOROTHIAZIDE 25 MG PO TABS: 25.0000 mg | ORAL_TABLET | Freq: Every day | ORAL | 1 refills | Status: DC

## 2016-09-22 NOTE — Progress Notes (Signed)
Tara Newman 45 y.o. [birth year] [id number] Referred by: Allegra Grana, FNP  Assessment & Plan:   Encounter Diagnoses  Name Primary?  Marland Kitchen Dysphagia, unspecified type Yes  . Globus sensation   . Tenderness of neck     It is a bit difficult to sort all of this out. She mainly has globus and some neck symptoms. The symptoms are constant. That goes against a primary esophageal process though doesn't rule it out. Not seem to have a greater her thyroid function is normal. I can detect no lymphadenopathy or anything concerning. Her neck is large so that could hide something. I'm going to start with a barium swallow and tablet to work this up. Depending upon what that shows the next steps could be imaging of the neck i.e. ultrasound versus CT, or an upper GI endoscopy. I've explained all of this including risks benefits and indications to the patient and she understands and agrees to proceed.  I appreciate the opportunity to care for this patient. CC: [name], FNP   Subjective:   Chief Complaint: Swallowing problems  HPI Patient is a very nice 46 year old single African-American woman of this had a 1 year history of swallowing difficulty and a lump in her left neck area. It's getting more frequent and really is a constant problem. She describes a lump sensation in the left neck, when she presses there she says it feels like her eyes are going to pop out. If she bends and flexes her neck she feels pressure in the face. She feels like there something sitting in her suprasternal area and upper chest most or all of the time. She does not have overt impact dysphagia. She has some heartburn. She feels swelling in the left upper chest at times. Thyroid function was checked and is normal. She lies flat she feels it more. She purchased a bed that will allow her to sleep with the head of the bed elevated helps. Is no change in voice.  Allergies  Allergen Reactions  . Penicillins Other  (See Comments)   Past Medical History:  Diagnosis Date  . Frequent headaches   . Hypertension    Past Surgical History:  Procedure Laterality Date  . broken ankle     Social History   Social History  . Marital status: Single    Spouse name: N/A  . Number of children: 1  . Years of education: N/A   Occupational History  . Diplomatic Services operational officer   Social History Main Topics  . Smoking status: Never Smoker  . Smokeless tobacco: Never Used  . Alcohol use No  . Drug use: No  . Sexual activity: Not Currently   Other Topics Concern  . None   Social History Narrative   Single. Has an adult daughter. She's a Print production planner at [employer]   No caffeine   09/22/2016      family history includes Aneurysm in her other; Arthritis in her father and mother; Breast cancer in her maternal aunt; Breast cancer (age of onset: 3) in her paternal aunt; Diabetes in her mother; Heart disease in her father; Hyperlipidemia in her sister; Hypertension in her mother; Kidney disease in her maternal aunt; Prostate cancer in her paternal grandfather; Thyroid cancer in her maternal aunt.   Review of Systems Positive for some fatigue dry skin with a bit of pedal edema. Has been having headaches and htn not controled - better nowAll other review of systems are negative.   @  BP 120/90   Pulse 80   Ht 5' 3.5" (1.613 m)   Wt 269 lb 2 oz (122.1 kg)   BMI 46.93 kg/m @  General:  Well-developed, well-nourished and in no acute distress - obese Eyes:  anicteric. ENT:   Mouth and posterior pharynx free of lesions.  Neck:   supple w/o thyromegaly or mass. There is slight fullness and tenderness in Left neck Lungs: Clear to auscultation bilaterally. Heart:  S1S2, no rubs, murmurs, gallops. Abdomen:  soft, non-tender, no hepatosplenomegaly, hernia, or mass and BS+.  Lymph:  no cervical or supraclavicular adenopathy. Extremities:   no edema, cyanosis or clubbing Skin   no rash.slight ?  Hyperpigmentation left upper chest Neuro:  A&O x 3.  Psych:  appropriate mood and  Affect.   Data Reviewed:  PCP note Lab Results  Component Value Date   TSH 2.27 09/17/2016  K 3.0   PA CXR 07/2016 sl tortuous aorta CLINICAL DATA:  Back pain  EXAM: THORACIC SPINE 2 VIEWS  COMPARISON:  CXR 03/09/2011  FINDINGS: Mild mid thoracic kyphosis secondary to multilevel disc space narrowing and degenerative endplate spurring. Minimal anterior height loss of T6 is suggested since 2012 which could be simply degenerative in etiology. No acute appearing fracture is identified. No bone destruction. No paraspinal soft tissue swelling or hematoma. Enthesophytes are seen along the right lateral aspect of the thoracic spine.  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   Electronically Signed   By: Tollie Ethavid  Kwon M.D.   On: 08/20/2016 00:13

## 2016-09-22 NOTE — Patient Instructions (Signed)
  You have been scheduled for a Barium Esophogram at [facility]  on 09/26/16 at 10:00AM. Please arrive 15 minutes prior to your appointment for registration. Make certain not to have anything to eat or drink 6 hours prior to your test. If you need to reschedule for any reason, please contact radiology at [phone] to do so. __________________________________________________________________ A barium swallow is an examination that concentrates on views of the esophagus. This tends to be a double contrast exam (barium and two liquids which, when combined, create a gas to distend the wall of the oesophagus) or single contrast (non-ionic iodine based). The study is usually tailored to your symptoms so a good history is essential. Attention is paid during the study to the form, structure and configuration of the esophagus, looking for functional disorders (such as aspiration, dysphagia, achalasia, motility and reflux) EXAMINATION You may be asked to change into a gown, depending on the type of swallow being performed. A radiologist and radiographer will perform the procedure. The radiologist will advise you of the type of contrast selected for your procedure and direct you during the exam. You will be asked to stand, sit or lie in several different positions and to hold a small amount of fluid in your mouth before being asked to swallow while the imaging is performed .In some instances you may be asked to swallow barium coated marshmallows to assess the motility of a solid food bolus. The exam can be recorded as a digital or video fluoroscopy procedure. POST PROCEDURE It will take 1-2 days for the barium to pass through your system. To facilitate this, it is important, unless otherwise directed, to increase your fluids for the next 24-48hrs and to resume your normal diet.  This test typically takes about 30 minutes to  perform. __________________________________________________________________________________   We will call you with results and plans.     I appreciate the opportunity to care for you. Stan Headarl Gessner, MD, [facility]

## 2016-09-22 NOTE — Telephone Encounter (Signed)
Patient has been informed.

## 2016-09-22 NOTE — Telephone Encounter (Signed)
Left message for mother, pt.   Spoke with daughter, Enrique Sackkendra.   Mom cannot get phone calls. Daughter will contact mom and have her call the office.   When daughter calls back-   Potassium is VERY low. Start 3 days of potassium to replenish.  We have decreased HCTZ.   May need to go up on amlodipine or add another agent for BP control.  Pended labs for today.   She needs to come asap.   Follow up next week.

## 2016-09-22 NOTE — Telephone Encounter (Signed)
Labs have been reordered. 

## 2016-09-23 ENCOUNTER — Telehealth: Payer: Self-pay | Admitting: *Deleted

## 2016-09-23 NOTE — Telephone Encounter (Signed)
Pt requested a call in reference to her potassium medication and labs  Pt contact [phone]

## 2016-09-23 NOTE — Telephone Encounter (Signed)
Patient would like to see cardiology for hypertension. Please advise.

## 2016-09-23 NOTE — Telephone Encounter (Signed)
Left message to return call back. 

## 2016-09-24 ENCOUNTER — Telehealth: Payer: Self-pay | Admitting: Family

## 2016-09-24 ENCOUNTER — Emergency Department
Admission: EM | Admit: 2016-09-24 | Discharge: 2016-09-25 | Disposition: A | Discharge status: Home / Self Care | Payer: PRIVATE HEALTH INSURANCE | Attending: Emergency Medicine | Admitting: Emergency Medicine

## 2016-09-24 ENCOUNTER — Emergency Department: Payer: PRIVATE HEALTH INSURANCE

## 2016-09-24 ENCOUNTER — Encounter: Payer: Self-pay | Admitting: Emergency Medicine

## 2016-09-24 DIAGNOSIS — R7989 Other specified abnormal findings of blood chemistry: Secondary | ICD-10-CM

## 2016-09-24 DIAGNOSIS — E876 Hypokalemia: Secondary | ICD-10-CM | POA: Diagnosis not present

## 2016-09-24 DIAGNOSIS — Z79899 Other long term (current) drug therapy: Secondary | ICD-10-CM | POA: Diagnosis not present

## 2016-09-24 DIAGNOSIS — I10 Essential (primary) hypertension: Secondary | ICD-10-CM | POA: Insufficient documentation

## 2016-09-24 DIAGNOSIS — R002 Palpitations: Secondary | ICD-10-CM | POA: Diagnosis present

## 2016-09-24 DIAGNOSIS — R946 Abnormal results of thyroid function studies: Secondary | ICD-10-CM | POA: Insufficient documentation

## 2016-09-24 LAB — BASIC METABOLIC PANEL
ANION GAP: 9 (ref 5–15)
BUN: 12 mg/dL (ref 6–20)
CO2: 31 mmol/L (ref 22–32)
Calcium: 9.3 mg/dL (ref 8.9–10.3)
Chloride: 97 mmol/L — ABNORMAL LOW (ref 101–111)
Creatinine, Ser: 0.74 mg/dL (ref 0.44–1.00)
GFR calc non Af Amer: >60 mL/min (ref >60)
GLUCOSE: 99 mg/dL (ref 65–99)
POTASSIUM: 2.9 mmol/L — AB (ref 3.5–5.1)
Sodium: 137 mmol/L (ref 135–145)

## 2016-09-24 LAB — CBC
HEMATOCRIT: 37.9 % (ref 35.0–47.0)
HEMOGLOBIN: 12.7 g/dL (ref 12.0–16.0)
MCH: 28 pg (ref 26.0–34.0)
MCHC: 33.6 g/dL (ref 32.0–36.0)
MCV: 83.2 fL (ref 80.0–100.0)
Platelets: 373 10*3/uL (ref 150–440)
RBC: 4.56 MIL/uL (ref 3.80–5.20)
RDW: 15.7 % — ABNORMAL HIGH (ref 11.5–14.5)
WBC: 7.1 10*3/uL (ref 3.6–11.0)

## 2016-09-24 LAB — TSH: TSH: 4.991 u[IU]/mL — ABNORMAL HIGH (ref 0.350–4.500)

## 2016-09-24 LAB — TROPONIN I

## 2016-09-24 LAB — MAGNESIUM: MAGNESIUM: 2.1 mg/dL (ref 1.7–2.4)

## 2016-09-24 MED ORDER — POTASSIUM CHLORIDE CRYS ER 20 MEQ PO TBCR
40.0000 meq | EXTENDED_RELEASE_TABLET | Freq: Once | ORAL | Status: AC
  Administered 2016-09-24: 40 meq via ORAL
  Filled 2016-09-24: qty 2

## 2016-09-24 MED ORDER — SODIUM CHLORIDE 0.9 % IV SOLN: Freq: Once | INTRAVENOUS | Status: DC

## 2016-09-24 MED ORDER — SODIUM CHLORIDE 0.9 % IV SOLN
Freq: Once | INTRAVENOUS | Status: AC
  Administered 2016-09-24: 21:00:00 via INTRAVENOUS
  Filled 2016-09-24: qty 500

## 2016-09-24 NOTE — Telephone Encounter (Signed)
Spoke with patient states she hasn't taken the HCTZ . Patient misunderstood she thought she was to stop when started Potassium.   Patient states palipations come and go heart rate ranges 110-115 per fit bit.   Patient describing having funny feeling in left arm since last seeing Claris CheMargaret it will come and go.   When she checked Blood pressure this am it was 143/86. Patient denied chest pain and numbness in left arm and neck pain.     Still taking Amlodipine as prescribed .   I advised patient she needed to go to ED to be worked up due to [place] wanted potassium checked today.   Patient declined to go to ED if palpitations get worse she will go. Patient states she just started on Potassium only taken 1-2 doses.   She will come in for appointment tomorrow am.

## 2016-09-24 NOTE — Telephone Encounter (Signed)
Left message to call on voicemail  

## 2016-09-24 NOTE — Discharge Instructions (Addendum)
Please continue to take all your medications as prescribed except for your HCTZ. Hold your HCTZ, until your physician reevaluates your medications.  Please have your primary care physician follow-up on your abnormal thyroid test.  Return to the emergency department if you develop chest pain, shortness of breath, palpitations, lightheadedness or fainting, or any other symptoms concerning to you.

## 2016-09-24 NOTE — ED Notes (Signed)
Per MD, BP not needed to be obtained q6230min.

## 2016-09-24 NOTE — Telephone Encounter (Signed)
Pt called and stated that she is c/o heart racing and palpitations. Pt is not sure if this is a side effect of the potassium. Please advise, thank you!  Call pt @ [phone]

## 2016-09-24 NOTE — ED Notes (Signed)
Pt c/o palpitations and cramping to extremities xfew days, states increased when pt started taking K pills from PCP, was told by PCP to come get lab work done

## 2016-09-24 NOTE — ED Notes (Signed)
Patient transported to X-ray 

## 2016-09-24 NOTE — Telephone Encounter (Signed)
Left message with patient.  I also spoke with daughter, Enrique Sackkendra, who will call and text her mom as well to have her call us.   She needs to go to triage and may need to  Go to ed.   I asked her to have her potassium checked today however she is not coming until tomorrow. I would like to know her potassium immediately which may require ED.   Has she been taking potassium as prescribed?  I also reduced the dose of the HCTZ she is only taking the 25mg  tab now?  Please let her know that as she requested , I also placed referral to cardiology.

## 2016-09-24 NOTE — ED Triage Notes (Signed)
Pt comes into the ED via POV sent form her PCP who called and informed her that her potassium was low at 3.  Patient was started on HCTZ a couple weeks ago.  Patient states she has had palpitations the past couple of days.  Patient was started on klor-con and amlodipine but her PCP wanted her blood rechecked.

## 2016-09-24 NOTE — Telephone Encounter (Signed)
Please advise 

## 2016-09-24 NOTE — ED Notes (Signed)
ED Provider at bedside. 

## 2016-09-24 NOTE — ED Notes (Signed)
Pt given meal tray, per Dr Sharma CovertNorman.

## 2016-09-24 NOTE — Telephone Encounter (Signed)
Left another message with patient to call office.

## 2016-09-24 NOTE — ED Provider Notes (Signed)
[facility] Emergency Department Provider Note  ____________________________________________  Time seen: Approximately 6:53 PM  I have reviewed the triage vital signs and the nursing notes.   HISTORY  Chief Complaint Abnormal Lab and Palpitations    HPI Tara Newman is a 46 y.o. female with a history of hypertension and hypokalemia presenting with palpitations. The patient reports that 2 weeks ago she was started on an antihypertensive as well as HCTZ. Last Wednesday, her PMD did routine blood work and noted her to be hypokalemic with a potassium of 3.0. She was even 3 days of potassium supplementation. The patient reports that over the past several days, every couple of hours she develops a palpitation sensation in the center of the chest without associated chest pain, pressure or tightness, shortness of breath, diaphoresis, nausea or vomiting, lightheadedness or syncope. When she checked her fit bit during these episodes, she had heart rates up to 126 and an associated sensation of anxiety. She occasionally has associated cramping in the left upper extremity and the left lower extremity. At this time she is asymptomatic.   Past Medical History:  Diagnosis Date  . Frequent headaches   . Hypertension     Patient Active Problem List   Diagnosis Date Noted  . Dysphagia 09/17/2016  . Hypertension 08/25/2016    Past Surgical History:  Procedure Laterality Date  . broken ankle      Current Outpatient Rx  . Order #: [id number] Class: Historical Med  . Order #: [id number] Class: Print  . Order #: [id number] Class: Normal  . Order #: [id number] Class: Normal    Allergies Penicillins  Family History  Problem Relation Age of Onset  . Arthritis Mother   . Hypertension Mother   . Diabetes Mother   . Arthritis Father   . Heart disease Father   . Hyperlipidemia Sister   . Kidney disease Maternal Aunt   . Breast cancer Maternal Aunt   . Thyroid cancer  Maternal Aunt   . Breast cancer Paternal Aunt [id number]  . Prostate cancer Paternal Grandfather   . Aneurysm Other   . Colon cancer Neg Hx   . Stomach cancer Neg Hx   . Rectal cancer Neg Hx   . Esophageal cancer Neg Hx   . Liver cancer Neg Hx     Social History Social History  Substance Use Topics  . Smoking status: Never Smoker  . Smokeless tobacco: Never Used  . Alcohol use No    Review of Systems Constitutional: No fever/chills.No lightheadedness or syncope. Eyes: No visual changes. ENT: No sore throat. No congestion or rhinorrhea. Cardiovascular: Denies chest pain. Positive palpitations. Respiratory: Denies shortness of breath.  No cough. Gastrointestinal: No abdominal pain.  No nausea, no vomiting.  No diarrhea.  No constipation. Genitourinary: Negative for dysuria. Musculoskeletal: Negative for back pain. Positive left upper cavity and left lower extremity pain. Skin: Negative for rash. Neurological: Negative for headaches. No focal numbness, tingling or weakness.   10-point ROS otherwise negative.  ____________________________________________   PHYSICAL EXAM:  VITAL SIGNS: ED Triage Vitals  Enc Vitals Group     BP 09/24/16 1819 (!) 202/104     Pulse Rate 09/24/16 1819 (!) 103     Resp 09/24/16 1819 18     Temp 09/24/16 1819 98.7 F (37.1 C)     Temp Source 09/24/16 1819 Oral     SpO2 09/24/16 1819 100 %     Weight 09/24/16 1820 269 lb (122 kg)     Height  09/24/16 1820 5\' 4"  (1.626 m)     Head Circumference --      Peak Flow --      Pain Score --      Pain Loc --      Pain Edu? --      Excl. in GC? --     Constitutional: Alert and oriented. Well appearing and in no acute distress. Answers questions appropriately. Eyes: Conjunctivae are normal.  EOMI. No scleral icterus. Head: Atraumatic. Nose: No congestion/rhinnorhea. Mouth/Throat: Mucous membranes are moist.  Neck: No stridor.  Supple.  No JVD. Cardiovascular: Normal rate, regular rhythm. No murmurs,  rubs or gallops.  Respiratory: Normal respiratory effort.  No accessory muscle use or retractions. Lungs CTAB.  No wheezes, rales or ronchi. Gastrointestinal: Soft, nontender and nondistended.  No guarding or rebound.  No peritoneal signs. Musculoskeletal: No LE edema. No ttp in the calves or palpable cords.  Negative Homan's sign. Neurologic:  A&Ox3.  Speech is clear.  Face and smile are symmetric.  EOMI.  Moves all extremities well. Skin:  Skin is warm, dry and intact. No rash noted. Psychiatric: Mood and affect are normal. Speech and behavior are normal.  Normal judgement.  ____________________________________________   LABS (all labs ordered are listed, but only abnormal results are displayed)  Labs Reviewed  BASIC METABOLIC PANEL - Abnormal; Notable for the following:       Result Value   Potassium 2.9 (*)    Chloride 97 (*)    All other components within normal limits  CBC - Abnormal; Notable for the following:    RDW 15.7 (*)    All other components within normal limits  TROPONIN I  MAGNESIUM  TSH   ____________________________________________  EKG  ED ECG REPORT I, Rockne Menghini, the attending physician, personally viewed and interpreted this ECG.   Date: 09/24/2016  EKG Time: 1826  Rate: 94  Rhythm: normal sinus rhythm  Axis: normal  Intervals:none  ST&T Change: No STEMI  ____________________________________________  RADIOLOGY  Dg Chest 2 View  Result Date: 09/24/2016 CLINICAL DATA:  Palpitations and hypokalemia. EXAM: CHEST  2 VIEW COMPARISON:  08/25/2016 FINDINGS: The heart size and mediastinal contours are within normal limits. Both lungs are clear. The visualized skeletal structures are unremarkable. IMPRESSION: Normal chest x-ray. Electronically Signed   By: Rudie Meyer M.D.   On: 09/24/2016 19:04    ____________________________________________   PROCEDURES  Procedure(s) performed: None  Procedures  Critical Care performed:  No ____________________________________________   INITIAL IMPRESSION / ASSESSMENT AND PLAN / ED COURSE  Pertinent labs & imaging results that were available during my care of the patient were reviewed by me and considered in my medical decision making (see chart for details).  46 y.o. female with a recent diagnosis of hypokalemia in the setting of a diuretic presenting with palpitations. Overall, the patient is hemodynamically stable at this time. There is no evidence of arrhythmia or electrocardiogram disturbance on her EKG. We will recheck her potassium, as well as get a magnesium and a TSH level. Plan reevaluation for final disposition.  ----------------------------------------- 7:17 PM on 09/24/2016 -----------------------------------------  The patient's repeat blood pressure is in the 160s. She is currently asymptomatic. Her potassium is 2.9 and I'll give her oral and IV supplementation. I've asked her to hold her HCTZ until she has talked to her physician about her medications. Plan discharge after that some supplementation. Close PMD follow-up and return precautions were discussed.  ____________________________________________  FINAL CLINICAL IMPRESSION(S) / ED DIAGNOSES  Final diagnoses:  Hypokalemia  Palpitations         NEW MEDICATIONS STARTED DURING THIS VISIT:  New Prescriptions   No medications on file      Rockne Menghini, MD 09/24/16 1918

## 2016-09-25 ENCOUNTER — Ambulatory Visit (INDEPENDENT_AMBULATORY_CARE_PROVIDER_SITE_OTHER): Payer: PRIVATE HEALTH INSURANCE | Admitting: Family

## 2016-09-25 ENCOUNTER — Other Ambulatory Visit: Payer: PRIVATE HEALTH INSURANCE

## 2016-09-25 ENCOUNTER — Ambulatory Visit: Payer: PRIVATE HEALTH INSURANCE | Admitting: Family

## 2016-09-25 ENCOUNTER — Encounter: Payer: Self-pay | Admitting: Family

## 2016-09-25 VITALS — BP 150/90 | HR 87 | Temp 98.3°F | Ht 64.0 in | Wt 274.4 lb

## 2016-09-25 DIAGNOSIS — R002 Palpitations: Secondary | ICD-10-CM

## 2016-09-25 DIAGNOSIS — I10 Essential (primary) hypertension: Secondary | ICD-10-CM | POA: Diagnosis not present

## 2016-09-25 NOTE — Progress Notes (Signed)
Pre visit review using our clinic review tool, if applicable. No additional management support is needed unless otherwise documented below in the visit note. 

## 2016-09-25 NOTE — Assessment & Plan Note (Signed)
Elevated this morning however patient has not taken amlodipine. No CP, SOB. Blood Pressure readings on amlodipine only this week and doing very well. However patient is slightly bothered by LE swelling. We jointly agreed that we would wait on labs to ensure that electrolytes normalized. At that point,  I would consider starting lisinopril with close vigilance electrolytes.

## 2016-09-25 NOTE — Progress Notes (Signed)
Subjective:    Patient ID: Tara NewcomerDonna K Brazel, female    DOB: [birth year], 46 y.o.   MRN: [id number]  CC: Tara NewcomerDonna K Hubble is a 46 y.o. female who presents today for an acute visit.    HPI: Hasn't taken amlodipine this morning. Stopped the hctz all together 3 days ago. Does report mild LE swelling on amlodipine. 'slightly bothered'  BP running 120/90  Went to ed last night for leg cramps, palpitations. Blood pressure initially elevated in the emergency room last night. 202/104. EKG normal sinus rhythm, no STEMI. Normal chest x-ray. Given oral and IV potassium   Denies exertional chest pain or pressure, numbness or tingling radiating to left arm or jaw, palpitations, dizziness, frequent headaches, changes in vision, or shortness of breath.       HISTORY:  Past Medical History:  Diagnosis Date  . Frequent headaches   . Hypertension    Past Surgical History:  Procedure Laterality Date  . broken ankle     Family History  Problem Relation Age of Onset  . Arthritis Mother   . Hypertension Mother   . Diabetes Mother   . Arthritis Father   . Heart disease Father   . Hyperlipidemia Sister   . Kidney disease Maternal Aunt   . Breast cancer Maternal Aunt   . Thyroid cancer Maternal Aunt   . Breast cancer Paternal Aunt [id number]  . Prostate cancer Paternal Grandfather   . Aneurysm Other   . Colon cancer Neg Hx   . Stomach cancer Neg Hx   . Rectal cancer Neg Hx   . Esophageal cancer Neg Hx   . Liver cancer Neg Hx     Allergies: Penicillins Current Outpatient Prescriptions on File Prior to Visit  Medication Sig Dispense Refill  . acetaminophen (TYLENOL) 500 MG tablet Take 500 mg by mouth as needed.    Marland Kitchen. amLODipine (NORVASC) 5 MG tablet Take 1 tablet (5 mg total) by mouth daily. 90 tablet 0  . potassium chloride SA (K-DUR,KLOR-CON) 10 MEQ tablet Take 2 tablets (20 mEq total) by mouth 2 (two) times daily. 6 tablet 0   No current facility-administered medications on file prior to  visit.     Social History  Substance Use Topics  . Smoking status: Never Smoker  . Smokeless tobacco: Never Used  . Alcohol use No    Review of Systems  Constitutional: Negative for chills and fever.  Respiratory: Negative for cough, shortness of breath and wheezing.   Cardiovascular: Positive for leg swelling. Negative for chest pain and palpitations.  Gastrointestinal: Negative for nausea and vomiting.  Neurological: Negative for headaches.      Objective:    BP (!) 150/90   Pulse 87   Temp 98.3 F (36.8 C) (Oral)   Ht 5\' 4"  (1.626 m)   Wt 274 lb 6.4 oz (124.5 kg)   LMP 09/17/2016   SpO2 98%   BMI 47.10 kg/m    Physical Exam  Constitutional: She appears well-developed and well-nourished.  Eyes: Conjunctivae are normal.  Cardiovascular: Normal rate, regular rhythm, normal heart sounds and normal pulses.   Pulmonary/Chest: Effort normal and breath sounds normal. She has no wheezes. She has no rhonchi. She has no rales.  Neurological: She is alert.  Skin: Skin is warm and dry.  Psychiatric: She has a normal mood and affect. Her speech is normal and behavior is normal. Thought content normal.  Vitals reviewed.      Assessment & Plan:    Problem  List Items Addressed This Visit      Cardiovascular and Mediastinum   Hypertension    Elevated this morning however patient has not taken amlodipine. No CP, SOB. Blood Pressure readings on amlodipine only this week and doing very well. However patient is slightly bothered by LE swelling. We jointly agreed that we would wait on labs to ensure that electrolytes normalized. At that point,  I would consider starting lisinopril with close vigilance electrolytes.      Relevant Medications   hydrochlorothiazide (HYDRODIURIL) 25 MG tablet     Other   Palpitations - Primary    Resolved. Reviewed emergency room notes. When compared to EKG from last night to prior EKG 08/25/2016. No significant changes. No acute ischemia.Neg  troponin last night. Jointly agreed no EKG needed at t this time. Pending mag, K,  Tsh.       Relevant Orders   TSH   T4, free   Basic metabolic panel   Magnesium       I am having Ms. Croom maintain her amLODipine, acetaminophen, potassium chloride, and hydrochlorothiazide.   Meds ordered this encounter  Medications  . hydrochlorothiazide (HYDRODIURIL) 25 MG tablet    Sig: Take 25 mg by mouth daily.    Refill:  0    Return precautions given.   Risks, benefits, and alternatives of the medications and treatment plan prescribed today were discussed, and patient expressed understanding.   Education regarding symptom management and diagnosis given to patient on AVS.  Continue to follow with [name], FNP for routine health maintenance.   Tara Newman and I agreed with plan.   [name], FNP

## 2016-09-25 NOTE — Patient Instructions (Addendum)
Labs today  Lets await labs today prior to starting any new medications  STOP HCTZ.

## 2016-09-25 NOTE — Assessment & Plan Note (Addendum)
Resolved. Reviewed emergency room notes. When compared to EKG from last night to prior EKG 08/25/2016. No significant changes. No acute ischemia.Neg troponin last night. Jointly agreed no EKG needed at t this time. Pending mag, K,  Tsh.

## 2016-09-26 ENCOUNTER — Ambulatory Visit
Admission: RE | Admit: 2016-09-26 | Discharge: 2016-09-26 | Disposition: A | Discharge status: Home / Self Care | Payer: PRIVATE HEALTH INSURANCE | Source: Ambulatory Visit | Attending: Internal Medicine | Admitting: Internal Medicine

## 2016-09-26 DIAGNOSIS — R1310 Dysphagia, unspecified: Secondary | ICD-10-CM | POA: Diagnosis present

## 2016-09-26 DIAGNOSIS — M542 Cervicalgia: Secondary | ICD-10-CM | POA: Diagnosis not present

## 2016-09-26 DIAGNOSIS — R0989 Other specified symptoms and signs involving the circulatory and respiratory systems: Secondary | ICD-10-CM

## 2016-09-26 LAB — T4, FREE: Free T4: 1.01 ng/dL (ref 0.60–1.60)

## 2016-09-26 LAB — MAGNESIUM: Magnesium: 2.3 mg/dL (ref 1.5–2.5)

## 2016-09-26 LAB — BASIC METABOLIC PANEL
BUN: 16 mg/dL (ref 6–23)
CO2: 28 mEq/L (ref 19–32)
Calcium: 9.1 mg/dL (ref 8.4–10.5)
Chloride: 99 mEq/L (ref 96–112)
Creatinine, Ser: 0.81 mg/dL (ref 0.40–1.20)
GFR: 97.71 mL/min (ref >60.00)
GLUCOSE: 73 mg/dL (ref 70–99)
POTASSIUM: 3.6 meq/L (ref 3.5–5.1)
SODIUM: 135 meq/L (ref 135–145)

## 2016-09-26 LAB — TSH: TSH: 6.41 u[IU]/mL — ABNORMAL HIGH (ref 0.35–4.50)

## 2016-10-01 NOTE — Progress Notes (Signed)
This test is ok I have communicated with her PCP and we do not think needs any other testing at this time but she can f/u with her as planned or in 1-2 mos If this does not go away can consider other testing with ultrasound or CT through PCP Let me know if she has ?

## 2016-10-06 ENCOUNTER — Telehealth: Payer: Self-pay | Admitting: Family

## 2016-10-06 DIAGNOSIS — I10 Essential (primary) hypertension: Secondary | ICD-10-CM

## 2016-10-06 MED ORDER — LISINOPRIL 5 MG PO TABS: 10.0000 mg | ORAL_TABLET | Freq: Every day | ORAL | 0 refills | Status: DC

## 2016-10-06 NOTE — Telephone Encounter (Signed)
Pt called this morning and I spoke with her-  States amlodipine is making her heart race. Lasts for couple of minutes and resolves on its own.  BP averages 146/78.    Hasn't taken amlodipine in 2 days, and has palpitations intermittently. No CP, SOB, pain in left arm. Also c/o leg swelling.   Will start lisinopril.

## 2016-10-09 ENCOUNTER — Telehealth: Payer: Self-pay | Admitting: *Deleted

## 2016-10-09 ENCOUNTER — Ambulatory Visit (INDEPENDENT_AMBULATORY_CARE_PROVIDER_SITE_OTHER): Payer: PRIVATE HEALTH INSURANCE | Admitting: Cardiology

## 2016-10-09 ENCOUNTER — Other Ambulatory Visit (INDEPENDENT_AMBULATORY_CARE_PROVIDER_SITE_OTHER): Payer: PRIVATE HEALTH INSURANCE

## 2016-10-09 ENCOUNTER — Encounter: Payer: Self-pay | Admitting: Cardiology

## 2016-10-09 VITALS — BP 160/98 | HR 95 | Ht 63.0 in | Wt 271.8 lb

## 2016-10-09 DIAGNOSIS — R079 Chest pain, unspecified: Secondary | ICD-10-CM

## 2016-10-09 DIAGNOSIS — I10 Essential (primary) hypertension: Secondary | ICD-10-CM

## 2016-10-09 DIAGNOSIS — Z6841 Body Mass Index (BMI) 40.0 and over, adult: Secondary | ICD-10-CM | POA: Diagnosis not present

## 2016-10-09 DIAGNOSIS — R0602 Shortness of breath: Secondary | ICD-10-CM

## 2016-10-09 DIAGNOSIS — R002 Palpitations: Secondary | ICD-10-CM

## 2016-10-09 DIAGNOSIS — E6609 Other obesity due to excess calories: Secondary | ICD-10-CM | POA: Diagnosis not present

## 2016-10-09 DIAGNOSIS — IMO0001 Reserved for inherently not codable concepts without codable children: Secondary | ICD-10-CM

## 2016-10-09 LAB — BASIC METABOLIC PANEL
BUN: 11 mg/dL (ref 6–23)
CALCIUM: 9.5 mg/dL (ref 8.4–10.5)
CO2: 28 mEq/L (ref 19–32)
CREATININE: 0.77 mg/dL (ref 0.40–1.20)
Chloride: 101 mEq/L (ref 96–112)
GFR: 103.58 mL/min (ref >60.00)
Glucose, Bld: 94 mg/dL (ref 70–99)
Potassium: 3.5 mEq/L (ref 3.5–5.1)
SODIUM: 136 meq/L (ref 135–145)

## 2016-10-09 NOTE — Telephone Encounter (Signed)
Patient requested a call , she questioned which OTC cough medication she could take for a cold, that would mix well with er blood pressure medication.  Pt contact [phone]

## 2016-10-09 NOTE — Patient Instructions (Signed)
Testing/Procedures: Your physician has requested that you have an echocardiogram. Echocardiography is a painless test that uses sound waves to create images of your heart. It provides your doctor with information about the size and shape of your heart and how well your heart's chambers and valves are working. This procedure takes approximately one hour. There are no restrictions for this procedure.  [facility]  Your caregiver has ordered a Stress Test with nuclear imaging. The purpose of this test is to evaluate the blood supply to your heart muscle. This procedure is referred to as a "Non-Invasive Stress Test." This is because other than having an IV started in your vein, nothing is inserted or "invades" your body. Cardiac stress tests are done to find areas of poor blood flow to the heart by determining the extent of coronary artery disease (CAD). Some patients exercise on a treadmill, which naturally increases the blood flow to your heart, while others who are  unable to walk on a treadmill due to physical limitations have a pharmacologic/chemical stress agent called Lexiscan . This medicine will mimic walking on a treadmill by temporarily increasing your coronary blood flow.   Please note: these test may take anywhere between 2-4 hours to complete  PLEASE REPORT TO [facility]  THE VOLUNTEERS AT THE FIRST DESK WILL DIRECT YOU WHERE TO GO  Date of Procedure:_Friday October 17, 2016 at 07:30AM_  Arrival Time for Procedure:__Arrive at 07:15AM to register_____    PLEASE NOTIFY THE OFFICE AT LEAST 24 HOURS IN ADVANCE IF YOU ARE UNABLE TO KEEP YOUR APPOINTMENT.  [phone] AND  PLEASE NOTIFY NUCLEAR MEDICINE AT [facility] AT LEAST 24 HOURS IN ADVANCE IF YOU ARE UNABLE TO KEEP YOUR APPOINTMENT. [phone]  How to prepare for your Myoview test:  1. Do not eat or drink after midnight 2. No caffeine for 24 hours prior to test 3. No smoking 24 hours prior to test. 4. Your medication  may be taken with water.  If your doctor stopped a medication because of this test, do not take that medication. 5. Ladies, please do not wear dresses.  Skirts or pants are appropriate. Please wear a short sleeve shirt. 6. No perfume, cologne or lotion. 7. Wear comfortable walking shoes. No heels!       Follow-Up: Your physician recommends that you schedule a follow-up appointment after testing.  It was a pleasure seeing you today here in the office. Please do not hesitate to give us a call back if you have any further questions. [phone]  Tara Newman, Tara Newman    Echocardiogram An echocardiogram, or echocardiography, uses sound waves (ultrasound) to produce an image of your heart. The echocardiogram is simple, painless, obtained within a short period of time, and offers valuable information to your health care provider. The images from an echocardiogram can provide information such as:  Evidence of coronary artery disease (CAD).  Heart size.  Heart muscle function.  Heart valve function.  Aneurysm detection.  Evidence of a past heart attack.  Fluid buildup around the heart.  Heart muscle thickening.  Assess heart valve function. Tell a health care provider about:  Any allergies you have.  All medicines you are taking, including vitamins, herbs, eye drops, creams, and over-the-counter medicines.  Any problems you or family members have had with anesthetic medicines.  Any blood disorders you have.  Any surgeries you have had.  Any medical conditions you have.  Whether you are pregnant or may be pregnant. What happens before  the procedure? No special preparation is needed. Eat and drink normally. What happens during the procedure?  In order to produce an image of your heart, gel will be applied to your chest and a wand-like tool (transducer) will be moved over your chest. The gel will help transmit the sound waves from the transducer. The sound waves will  harmlessly bounce off your heart to allow the heart images to be captured in real-time motion. These images will then be recorded.  You may need an IV to receive a medicine that improves the quality of the pictures. What happens after the procedure? You may return to your normal schedule including diet, activities, and medicines, unless your health care provider tells you otherwise. This information is not intended to replace advice given to you by your health care provider. Make sure you discuss any questions you have with your health care provider. Document Released: 07/11/2000 Document Revised: 03/01/2016 Document Reviewed: 03/21/2013 Elsevier Interactive Patient Education  2017 [employer] Pharmacologic Stress Electrocardiogram Introduction A pharmacologic stress electrocardiogram is a heart (cardiac) test that uses nuclear imaging to evaluate the blood supply to your heart. This test may also be called a pharmacologic stress electrocardiography. Pharmacologic means that a medicine is used to increase your heart rate and blood pressure. This stress test is done to find areas of poor blood flow to the heart by determining the extent of coronary artery disease (CAD). Some people exercise on a treadmill, which naturally increases the blood flow to the heart. For those people unable to exercise on a treadmill, a medicine is used. This medicine stimulates your heart and will cause your heart to beat harder and more quickly, as if you were exercising. Pharmacologic stress tests can help determine:  The adequacy of blood flow to your heart during increased levels of activity in order to clear you for discharge home.  The extent of coronary artery blockage caused by CAD.  Your prognosis if you have suffered a heart attack.  The effectiveness of cardiac procedures done, such as an angioplasty, which can increase the circulation in your coronary arteries.  Causes of chest pain or pressure. LET  [facility] CARE PROVIDER KNOW ABOUT:  Any allergies you have.  All medicines you are taking, including vitamins, herbs, eye drops, creams, and over-the-counter medicines.  Previous problems you or members of your family have had with the use of anesthetics.  Any blood disorders you have.  Previous surgeries you have had.  Medical conditions you have.  Possibility of pregnancy, if this applies.  If you are currently breastfeeding. RISKS AND COMPLICATIONS Generally, this is a safe procedure. However, as with any procedure, complications can occur. Possible complications include:  You develop pain or pressure in the following areas:  Chest.  Jaw or neck.  Between your shoulder blades.  Radiating down your left arm.  Headache.  Dizziness or light-headedness.  Shortness of breath.  Increased or irregular heartbeat.  Low blood pressure.  Nausea or vomiting.  Flushing.  Redness going up the arm and slight pain during injection of medicine.  Heart attack (rare). BEFORE THE PROCEDURE  Avoid all forms of caffeine for 24 hours before your test or as directed by your health care provider. This includes coffee, tea (even decaffeinated tea), caffeinated sodas, chocolate, cocoa, and certain pain medicines.  Follow your health care provider's instructions regarding eating and drinking before the test.  Take your medicines as directed at regular times with water unless instructed otherwise. Exceptions may include:  If you have diabetes, ask how you are to take your insulin or pills. It is common to adjust insulin dosing the morning of the test.  If you are taking beta-blocker medicines, it is important to talk to your health care provider about these medicines well before the date of your test. Taking beta-blocker medicines may interfere with the test. In some cases, these medicines need to be changed or stopped 24 hours or more before the test.  If you wear a nitroglycerin  patch, it may need to be removed prior to the test. Ask your health care provider if the patch should be removed before the test.  If you use an inhaler for any breathing condition, bring it with you to the test.  If you are an outpatient, bring a snack so you can eat right after the stress phase of the test.  Do not smoke for 4 hours prior to the test or as directed by your health care provider.  Do not apply lotions, powders, creams, or oils on your chest prior to the test.  Wear comfortable shoes and clothing. Let your health care provider know if you were unable to complete or follow the preparations for your test. PROCEDURE  Multiple patches (electrodes) will be put on your chest. If needed, small areas of your chest may be shaved to get better contact with the electrodes. Once the electrodes are attached to your body, multiple wires will be attached to the electrodes, and your heart rate will be monitored.  An IV access will be started. A nuclear trace (isotope) is given. The isotope may be given intravenously, or it may be swallowed. Nuclear refers to several types of radioactive isotopes, and the nuclear isotope lights up the arteries so that the nuclear images are clear. The isotope is absorbed by your body. This results in low radiation exposure.  A resting nuclear image is taken to show how your heart functions at rest.  A medicine is given through the IV access.  A second scan is done about 1 hour after the medicine injection and determines how your heart functions under stress.  During this stress phase, you will be connected to an electrocardiogram machine. Your blood pressure and oxygen levels will be monitored. What to expect after the procedure  Your heart rate and blood pressure will be monitored after the test.  You may return to your normal schedule, including diet,activities, and medicines, unless your health care provider tells you otherwise. This information is  not intended to replace advice given to you by your health care provider. Make sure you discuss any questions you have with your health care provider. Document Released: 11/30/2008 Document Revised: 12/20/2015 Document Reviewed: 01/21/2016 Elsevier Interactive Patient Education  2017 [employer] Inc.   Hypertension Hypertension is another name for high blood pressure. High blood pressure forces your heart to work harder to pump blood. This can cause problems over time. There are two numbers in a blood pressure reading. There is a top number (systolic) over a bottom number (diastolic). It is best to have a blood pressure below 120/80. Healthy choices can help lower your blood pressure. You may need medicine to help lower your blood pressure if:  Your blood pressure cannot be lowered with healthy choices.  Your blood pressure is higher than 130/80. Follow these instructions at home: Eating and drinking   If directed, follow the DASH eating plan. This diet includes:  Filling half of your plate at each meal with fruits  and vegetables.  Filling one quarter of your plate at each meal with whole grains. Whole grains include whole wheat pasta, brown rice, and whole grain bread.  Eating or drinking low-fat dairy products, such as skim milk or low-fat yogurt.  Filling one quarter of your plate at each meal with low-fat (lean) proteins. Low-fat proteins include fish, skinless chicken, eggs, beans, and tofu.  Avoiding fatty meat, cured and processed meat, or chicken with skin.  Avoiding premade or processed food.  Eat less than 1,500 mg of salt (sodium) a day.  Limit alcohol use to no more than 1 drink a day for nonpregnant women and 2 drinks a day for men. One drink equals 12 oz of beer, 5 oz of wine, or 1 oz of hard liquor. Lifestyle   Work with your doctor to stay at a healthy weight or to lose weight. Ask your doctor what the best weight is for you.  Get at least 30 minutes of exercise  that causes your heart to beat faster (aerobic exercise) most days of the week. This may include walking, swimming, or biking.  Get at least 30 minutes of exercise that strengthens your muscles (resistance exercise) at least 3 days a week. This may include lifting weights or pilates.  Do not use any products that contain nicotine or tobacco. This includes cigarettes and e-cigarettes. If you need help quitting, ask your doctor.  Check your blood pressure at home as told by your doctor.  Keep all follow-up visits as told by your doctor. This is important. Medicines   Take over-the-counter and prescription medicines only as told by your doctor. Follow directions carefully.  Do not skip doses of blood pressure medicine. The medicine does not work as well if you skip doses. Skipping doses also puts you at risk for problems.  Ask your doctor about side effects or reactions to medicines that you should watch for. Contact a doctor if:  You think you are having a reaction to the medicine you are taking.  You have headaches that keep coming back (recurring).  You feel dizzy.  You have swelling in your ankles.  You have trouble with your vision. Get help right away if:  You get a very bad headache.  You start to feel confused.  You feel weak or numb.  You feel faint.  You get very bad pain in your:  Chest.  Belly (abdomen).  You throw up (vomit) more than once.  You have trouble breathing. Summary  Hypertension is another name for high blood pressure.  Making healthy choices can help lower blood pressure. If your blood pressure cannot be controlled with healthy choices, you may need to take medicine. This information is not intended to replace advice given to you by your health care provider. Make sure you discuss any questions you have with your health care provider. Document Released: 12/31/2007 Document Revised: 06/11/2016 Document Reviewed: 06/11/2016 Elsevier  Interactive Patient Education  2017 [employer].  How to Take Your Blood Pressure You can take your blood pressure at home with a machine. You may need to check your blood pressure at home:  To check if you have high blood pressure (hypertension).  To check your blood pressure over time.  To make sure your blood pressure medicine is working. Supplies needed: You will need a blood pressure machine, or monitor. You can buy one at a drugstore or online. When choosing one:  Choose one with an arm cuff.  Choose one that wraps  around your upper arm. Only one finger should fit between your arm and the cuff.  Do not choose one that measures your blood pressure from your wrist or finger. Your doctor can suggest a monitor. How to prepare Avoid these things for 30 minutes before checking your blood pressure:  Drinking caffeine.  Drinking alcohol.  Eating.  Smoking.  Exercising. Five minutes before checking your blood pressure:  Pee.  Sit in a dining chair. Avoid sitting in a soft couch or armchair.  Be quiet. Do not talk. How to take your blood pressure Follow the instructions that came with your machine. If you have a digital blood pressure monitor, these may be the instructions: 1. Sit up straight. 2. Place your feet on the floor. Do not cross your ankles or legs. 3. Rest your left arm at the level of your heart. You may rest it on a table, desk, or chair. 4. Pull up your shirt sleeve. 5. Wrap the blood pressure cuff around the upper part of your left arm. The cuff should be 1 inch (2.5 cm) above your elbow. It is best to wrap the cuff around bare skin. 6. Fit the cuff snugly around your arm. You should be able to place only one finger between the cuff and your arm. 7. Put the cord inside the groove of your elbow. 8. Press the power button. 9. Sit quietly while the cuff fills with air and loses air. 10. Write down the numbers on the screen. 11. Wait 2-3 minutes and then  repeat steps 1-10. What do the numbers mean? Two numbers make up your blood pressure. The first number is called systolic pressure. The second is called diastolic pressure. An example of a blood pressure reading is "120 over 80" (or 120/80). If you are an adult and do not have a medical condition, use this guide to find out if your blood pressure is normal: Normal   First number: below 120.  Second number: below 80. Elevated   First number: 120-129.  Second number: below 80. Hypertension stage 1   First number: 130-139.  Second number: 80-89. Hypertension stage 2   First number: 140 or above.  Second number: 90 or above. Your blood pressure is above normal even if only the top or bottom number is above normal. Follow these instructions at home:  Check your blood pressure as often as your doctor tells you to.  Take your monitor to your next doctor's appointment. Your doctor will:  Make sure you are using it correctly.  Make sure it is working right.  Make sure you understand what your blood pressure numbers should be.  Tell your doctor if your medicines are causing side effects. Contact a doctor if:  Your blood pressure keeps being high. Get help right away if:  Your first blood pressure number is higher than 180.  Your second blood pressure number is higher than 120. This information is not intended to replace advice given to you by your health care provider. Make sure you discuss any questions you have with your health care provider. Document Released: 06/26/2008 Document Revised: 06/11/2016 Document Reviewed: 12/21/2015 Elsevier Interactive Patient Education  2017 [employer] Inc. Blood Pressure Record Sheet Your blood pressure on this visit to the emergency department or clinic is elevated. This does not necessarily mean you have high blood pressure (hypertension), but it does mean that your blood pressure needs to be rechecked. Many times your blood pressure can  increase due to illness, pain, anxiety,  or other factors. We recommend that you get a series of blood pressure readings done over a period of 5 days. It is best to get a reading in the morning and one in the evening. You should make sure to sit and relax for 1-5 minutes before the reading is taken. Write the readings down and make a follow-up appointment with your health care provider to discuss the results. If there is not a free clinic or a drug store with a blood-pressure-taking machine near you, you can purchase blood-pressure-taking equipment from a drug store. Having one in the home allows you the convenience of taking your blood pressure while you are home and relaxed. Blood Pressure Log Date: _______________________  a.m. _____________________  p.m. _____________________ Date: _______________________  a.m. _____________________  p.m. _____________________ Date: _______________________  a.m. _____________________  p.m. _____________________ Date: _______________________  a.m. _____________________  p.m. _____________________ Date: _______________________  a.m. _____________________  p.m. _____________________ This information is not intended to replace advice given to you by your health care provider. Make sure you discuss any questions you have with your health care provider. Document Released: 04/12/2003 Document Revised: 06/27/2016 Document Reviewed: 09/06/2013 Elsevier Interactive Patient Education  2017 [employer].

## 2016-10-09 NOTE — Progress Notes (Signed)
Cardiology Office Note   Date:  10/09/2016   ID:  Tara NewcomerDonna K Cleere, DOB [birth year], MRN [id number]  Referring Doctor:  Rennie PlowmanMargaret Arnett, FNP   Cardiologist:   Almond LintAileen Yamaira Spinner, MD   Reason for consultation:  Chief Complaint  Patient presents with  . other    Heart Palpitations and jaw tightening c/o low potassium with fluid pill . Meds reviewed verbally with pt.      History of Present Illness: Tara Newman is a 46 y.o. female who presents for Palpitations, tightness in the jaw, shortness of breath  Several weeks ago, patient was started on amlodipine for blood pressure control. She said that she did not respond to that medication well, developed significant palpitations. She described bracing heart rate. That symptom went away after she stopped taking the amlodipine. More recently, she has been dealing with a cough and a cold and had to take some over-the-counter decongestants. Since then, she started noticing some fluttering in her chest, not as bad as when she was on amlodipine. She is not sure now whether this is related to her ongoing cough and colds.  Patient reports history of shortness of breath with exertion as well as tightness in the chest and jaw area. This has been going on for years, worse over time, exertionally related, moderate in intensity, lasting minutes at a time, goes away with rest.  Patient reports family history of CAD in father in his 7940s.   ROS:  Please see the history of present illness. Aside from mentioned under HPI, all other systems are reviewed and negative.     Past Medical History:  Diagnosis Date  . Frequent headaches   . Hypertension     Past Surgical History:  Procedure Laterality Date  . broken ankle       reports that she has never smoked. She has never used smokeless tobacco. She reports that she does not drink alcohol or use drugs.   family history includes Aneurysm in her other; Arthritis in her father and mother; Breast  cancer in her maternal aunt; Breast cancer (age of onset: 3038) in her paternal aunt; Diabetes in her mother; Heart disease in her father; Hyperlipidemia in her sister; Hypertension in her mother; Kidney disease in her maternal aunt; Prostate cancer in her paternal grandfather; Thyroid cancer in her maternal aunt.   Outpatient Medications Prior to Visit  Medication Sig Dispense Refill  . acetaminophen (TYLENOL) 500 MG tablet Take 500 mg by mouth as needed.    Marland Kitchen. lisinopril (PRINIVIL,ZESTRIL) 5 MG tablet Take 2 tablets (10 mg total) by mouth daily. 90 tablet 0   No facility-administered medications prior to visit.      Allergies: Penicillins    PHYSICAL EXAM: VS:  BP (!) 160/98 (BP Location: Left Arm, Patient Position: Sitting, Cuff Size: Large)   Pulse 95   Ht 5\' 3"  (1.6 m)   Wt 271 lb 12 oz (123.3 kg)   LMP 09/17/2016   BMI 48.14 kg/m  , Body mass index is 48.14 kg/m. Wt Readings from Last 3 Encounters:  10/09/16 271 lb 12 oz (123.3 kg)  09/25/16 274 lb 6.4 oz (124.5 kg)  09/24/16 269 lb (122 kg)    GENERAL:  well developed, well nourished, obese, not in acute distress HEENT: normocephalic, pink conjunctivae, anicteric sclerae, no xanthelasma, normal dentition, oropharynx clear NECK:  no neck vein engorgement, JVP normal, no hepatojugular reflux, carotid upstroke brisk and symmetric, no bruit, no thyromegaly, no lymphadenopathy LUNGS:  good  respiratory effort, clear to auscultation bilaterally CV:  PMI not displaced, no thrills, no lifts, S1 and S2 within normal limits, no palpable S3 or S4, no murmurs, no rubs, no gallops ABD:  Soft, nontender, nondistended, normoactive bowel sounds, no abdominal aortic bruit, no hepatomegaly, no splenomegaly MS: nontender back, no kyphosis, no scoliosis, no joint deformities EXT:  2+ DP/PT pulses, no edema, no varicosities, no cyanosis, no clubbing SKIN: warm, nondiaphoretic, normal turgor, no ulcers NEUROPSYCH: alert, oriented to person, place,  and time, sensory/motor grossly intact, normal mood, appropriate affect  Recent Labs: 09/17/2016: ALT 14 09/24/2016: Hemoglobin 12.7; Platelets 373 09/25/2016: BUN 16; Creatinine, Ser 0.81; Magnesium 2.3; Potassium 3.6; Sodium 135; TSH 6.41   Lipid Panel    Component Value Date/Time   CHOL 243 (H) 09/17/2016 1452   TRIG 138.0 09/17/2016 1452   HDL 46.60 09/17/2016 1452   CHOLHDL 5 09/17/2016 1452   VLDL 27.6 09/17/2016 1452   LDLCALC 169 (H) 09/17/2016 1452     Other studies Reviewed:  EKG:  The ekg from 10/09/2016 was personally reviewed by me and it revealed sinus rhythm, 95 BPM. Nonspecific ST-T wave changes.  Additional studies/ records that were reviewed personally reviewed by me today include: None available   ASSESSMENT AND PLAN: Palpitations Resolved after discontinuation of a blowing DP Recommend to continue to monitor for now. Avoid over-the-counter decongestants. If her symptoms persist despite resolution of cough and colds and not using decongestants, that will be the time to do a long-term monitor. Patient verbalized understanding and agreed with plan  Shortness of breath with exertion Jaw tightness, chest tightness or chest pain Abnormal EKG with nonspecific ST-T wave changes Rec further evaluation with echocardiogram. Also recommend pharmacologic nuclear stress test, patient may not be able to walk on the treadmill.  Hypertension Be managed by PCP. Recommend blood pressure log. Recommend to speak to PCP regarding sleep study. Patient knows that she snores. May up titrate ACE inhibitor or add beta blocker if needed for blood pressure control. Will defer to PCP. Weight loss recommended. Discussed importance of dietary changes, sodium restriction.  Obesity Body mass index is 48.14 kg/m.Marland Kitchen Recommend aggressive weight loss through diet and increased physical activity.     Current medicines are reviewed at length with the patient today.  The patient does not  have concerns regarding medicines.  Labs/ tests ordered today include:  Orders Placed This Encounter  Procedures  . NM Myocar Multi W/Spect W/Wall Motion / EF  . ECHOCARDIOGRAM COMPLETE    I had a lengthy and detailed discussion with the patient regarding diagnoses, prognosis, diagnostic options, treatment options , and side effects of medications.   I counseled the patient on importance of lifestyle modification including heart healthy diet, regular physical activity once cardiac workup is completed   Disposition:   FU with Cardiology after tests   Thank you for this consultation. We will forwarding this consultation to referring physician.   Signed, Almond Lint, MD  10/09/2016 2:52 PM    [facility]  This note was generated in part with voice recognition software and I apologize for any typographical errors that were not detected and corrected.

## 2016-10-10 ENCOUNTER — Other Ambulatory Visit: Payer: Self-pay | Admitting: Family

## 2016-10-10 DIAGNOSIS — I10 Essential (primary) hypertension: Secondary | ICD-10-CM

## 2016-10-10 NOTE — Telephone Encounter (Signed)
Please advise 

## 2016-10-14 NOTE — Addendum Note (Signed)
Addended by: Kendrick FriesLOPEZ, MARINA C on: 10/14/2016 07:25 AM   Modules accepted: Orders

## 2016-10-16 ENCOUNTER — Other Ambulatory Visit: Payer: PRIVATE HEALTH INSURANCE

## 2016-10-23 ENCOUNTER — Encounter: Payer: Self-pay | Admitting: Family

## 2016-10-23 NOTE — Progress Notes (Signed)
I sent my chart message to patient.

## 2016-10-27 NOTE — Telephone Encounter (Signed)
Investigated that there were two messages that had not been relied to the patient.  Discussed with Tara Newman and responded to the patient in a mychart as she is not able to get phone calls during the day at work.  Awaiting her response. thanks

## 2016-11-07 ENCOUNTER — Other Ambulatory Visit: Payer: PRIVATE HEALTH INSURANCE

## 2016-11-13 ENCOUNTER — Ambulatory Visit: Payer: PRIVATE HEALTH INSURANCE | Admitting: Cardiology

## 2016-12-12 ENCOUNTER — Telehealth: Payer: Self-pay | Admitting: Cardiology

## 2016-12-12 ENCOUNTER — Other Ambulatory Visit: Payer: Self-pay

## 2016-12-12 ENCOUNTER — Ambulatory Visit (INDEPENDENT_AMBULATORY_CARE_PROVIDER_SITE_OTHER): Payer: PRIVATE HEALTH INSURANCE

## 2016-12-12 ENCOUNTER — Encounter: Payer: Self-pay | Admitting: *Deleted

## 2016-12-12 DIAGNOSIS — R002 Palpitations: Secondary | ICD-10-CM

## 2016-12-12 DIAGNOSIS — R0602 Shortness of breath: Secondary | ICD-10-CM | POA: Diagnosis not present

## 2016-12-12 DIAGNOSIS — R079 Chest pain, unspecified: Secondary | ICD-10-CM

## 2016-12-12 NOTE — Telephone Encounter (Signed)
Patient here in the office and needs to reschedule stress testing and follow up appointment. Rescheduled Lexiscan for next Friday 12/19/16 and provided patient with letter of instructions for this test. Also scheduled follow up appointment for her as well. She verbalized understanding of our conversation, agreement with plan, and had no further questions at this time.

## 2016-12-12 NOTE — Telephone Encounter (Signed)
Pt needs to r/s her nuclear stress test, and also needs a f/u scheduled afterwards.

## 2016-12-19 ENCOUNTER — Encounter
Admission: RE | Admit: 2016-12-19 | Discharge: 2016-12-19 | Disposition: A | Discharge status: Home / Self Care | Payer: PRIVATE HEALTH INSURANCE | Source: Ambulatory Visit | Attending: Cardiology | Admitting: Cardiology

## 2016-12-19 DIAGNOSIS — R0602 Shortness of breath: Secondary | ICD-10-CM | POA: Diagnosis not present

## 2016-12-19 DIAGNOSIS — R079 Chest pain, unspecified: Secondary | ICD-10-CM | POA: Insufficient documentation

## 2016-12-19 DIAGNOSIS — R002 Palpitations: Secondary | ICD-10-CM | POA: Diagnosis not present

## 2016-12-19 LAB — NM MYOCAR MULTI W/SPECT W/WALL MOTION / EF
CHL CUP NUCLEAR SSS: 2
CHL CUP RESTING HR STRESS: 63 {beats}/min
CSEPEDS: 0 s
CSEPEW: 1 METS
CSEPPHR: 115 {beats}/min
Exercise duration (min): 0 min
LV dias vol: 119 mL (ref 46–106)
LV sys vol: 62 mL
MPHR: 174 {beats}/min
NUC STRESS TID: 1.15
Percent HR: 66 %
SDS: 0
SRS: 5

## 2016-12-19 MED ORDER — TECHNETIUM TC 99M TETROFOSMIN IV KIT
12.4100 | PACK | Freq: Once | INTRAVENOUS | Status: AC | PRN
  Administered 2016-12-19: 12.41 via INTRAVENOUS

## 2016-12-19 MED ORDER — REGADENOSON 0.4 MG/5ML IV SOLN
0.4000 mg | Freq: Once | INTRAVENOUS | Status: AC
  Administered 2016-12-19: 0.4 mg via INTRAVENOUS

## 2016-12-19 MED ORDER — TECHNETIUM TC 99M TETROFOSMIN IV KIT
31.3600 | PACK | Freq: Once | INTRAVENOUS | Status: AC | PRN
  Administered 2016-12-19: 31.36 via INTRAVENOUS

## 2016-12-23 ENCOUNTER — Telehealth: Payer: Self-pay | Admitting: *Deleted

## 2016-12-23 NOTE — Telephone Encounter (Signed)
-----   Message from Yvonne Kendallhristopher End, MD sent at 12/19/2016  2:42 PM EDT ----- Please let the patient know that her stress test does not show any significant abnormalities. If she is still having pain, she should schedule an appointment to be seen at her convenience. Otherwise, she can return to the clinic as needed.

## 2016-12-23 NOTE — Telephone Encounter (Signed)
Results called to pt. Pt verbalized understanding. Patient is not experiencing any further pain at this time, only occasional palpitations. Patient let me know her medications were changed by Dr Allena KatzPatel at [facility] in [place], [place] in April. She is no longer taking lisinopril. She is taking Losartan 100 mg daily and furosemide 20 mg daily. Chart updated and she will keep upcoming appt with Eula Listenyan Dunn, PA on 01/23/17.

## 2017-01-23 ENCOUNTER — Ambulatory Visit: Payer: PRIVATE HEALTH INSURANCE | Admitting: Physician Assistant

## 2017-02-16 ENCOUNTER — Encounter: Payer: Self-pay | Admitting: Physician Assistant

## 2017-02-17 ENCOUNTER — Ambulatory Visit: Payer: PRIVATE HEALTH INSURANCE | Admitting: Physician Assistant

## 2017-02-18 ENCOUNTER — Ambulatory Visit: Payer: PRIVATE HEALTH INSURANCE | Admitting: Physician Assistant

## 2017-02-20 ENCOUNTER — Ambulatory Visit: Payer: PRIVATE HEALTH INSURANCE | Admitting: Physician Assistant

## 2017-05-16 ENCOUNTER — Emergency Department
Admission: EM | Admit: 2017-05-16 | Discharge: 2017-05-17 | Disposition: A | Discharge status: Home / Self Care | Payer: PRIVATE HEALTH INSURANCE | Attending: Student in an Organized Health Care Education/Training Program | Admitting: Student in an Organized Health Care Education/Training Program

## 2017-05-16 ENCOUNTER — Other Ambulatory Visit: Payer: Self-pay

## 2017-05-16 ENCOUNTER — Emergency Department: Payer: PRIVATE HEALTH INSURANCE

## 2017-05-16 DIAGNOSIS — R42 Dizziness and giddiness: Secondary | ICD-10-CM | POA: Diagnosis present

## 2017-05-16 DIAGNOSIS — I10 Essential (primary) hypertension: Secondary | ICD-10-CM | POA: Diagnosis not present

## 2017-05-16 DIAGNOSIS — R040 Epistaxis: Secondary | ICD-10-CM | POA: Diagnosis not present

## 2017-05-16 DIAGNOSIS — Z79899 Other long term (current) drug therapy: Secondary | ICD-10-CM | POA: Insufficient documentation

## 2017-05-16 DIAGNOSIS — R030 Elevated blood-pressure reading, without diagnosis of hypertension: Secondary | ICD-10-CM | POA: Diagnosis present

## 2017-05-16 LAB — BASIC METABOLIC PANEL WITH GFR
Anion gap: 7 (ref 5–15)
BUN: 20 mg/dL (ref 6–20)
CO2: 27 mmol/L (ref 22–32)
Calcium: 9 mg/dL (ref 8.9–10.3)
Chloride: 105 mmol/L (ref 101–111)
Creatinine, Ser: 1.06 mg/dL — ABNORMAL HIGH (ref 0.44–1.00)
GFR calc Af Amer: >60 mL/min
GFR calc non Af Amer: >60 mL/min
Glucose, Bld: 94 mg/dL (ref 65–99)
Potassium: 3.4 mmol/L — ABNORMAL LOW (ref 3.5–5.1)
Sodium: 139 mmol/L (ref 135–145)

## 2017-05-16 LAB — CBC
HCT: 35.6 % (ref 35.0–47.0)
Hemoglobin: 12.4 g/dL (ref 12.0–16.0)
MCH: 29.3 pg (ref 26.0–34.0)
MCHC: 34.8 g/dL (ref 32.0–36.0)
MCV: 84.3 fL (ref 80.0–100.0)
PLATELETS: 360 10*3/uL (ref 150–440)
RBC: 4.23 MIL/uL (ref 3.80–5.20)
RDW: 15.8 % — ABNORMAL HIGH (ref 11.5–14.5)
WBC: 5.9 10*3/uL (ref 3.6–11.0)

## 2017-05-16 LAB — TROPONIN I: Troponin I: <0.03 ng/mL (ref <0.03)

## 2017-05-16 MED ORDER — METOPROLOL TARTRATE 25 MG PO TABS
12.5000 mg | ORAL_TABLET | Freq: Once | ORAL | Status: AC
  Administered 2017-05-16: 12.5 mg via ORAL
  Filled 2017-05-16: qty 1

## 2017-05-16 MED ORDER — AMLODIPINE BESYLATE 5 MG PO TABS
10.0000 mg | ORAL_TABLET | Freq: Once | ORAL | Status: DC
  Filled 2017-05-16: qty 2

## 2017-05-16 NOTE — ED Triage Notes (Signed)
Reports earlier had a "funny feeling", had a nose bleed, then felt pulse in left wrist was pounding and a tense feeling moved up her left arm.  Checked blood pressure at home and it was 203/103.  No active nose bleed at this time.

## 2017-05-16 NOTE — ED Notes (Signed)
ED Provider at bedside. 

## 2017-05-16 NOTE — ED Provider Notes (Signed)
[facility] Emergency Department Provider Note    None    (approximate)  I have reviewed the triage vital signs and the nursing notes.   HISTORY  Chief Complaint No chief complaint on file.    HPI Tara NewcomerDonna K Sylvestre is a 46 y.o. female presents with chief complaint of left wrist pain and left neck pain lightheadedness dizziness nausea and feeling that her blood pressure was elevated at this evening after the a [place] fried chicken.  Patient states that she also noted that her nose started bleeding and that is when she knew something was "up" so she came to the ER.  Patient denies any chest pain or shortness of breath at this time.  Denies any lower extremity swelling.  She has been compliant with her medications but normally has a very healthy diet and just tonight ate [place] breast with her husband and started feeling unwell.  Denies any numbness or tingling.  States that she has had intermittent headache over the past week but denies any headache or numbness or tingling at this time.  No weakness.   NM Myocard exam 5/18 Pharmacological myocardial perfusion imaging study with no significant  Ischemia Small region of mild fixed defect in the distal anteroseptal region consistent with breast attenuation artifact Normal wall motion, EF estimated at 55% No EKG changes concerning for ischemia at peak stress or in recovery. Low risk scan  Echo 5/18 - Left ventricle: The cavity size was normal. Systolic function was   normal. The estimated ejection fraction was in the range of 55%   to 60%. Wall motion was normal; there were no regional wall   motion abnormalities. Doppler parameters are consistent with   abnormal left ventricular relaxation (grade 1 diastolic   dysfunction). - Mitral valve: There was mild regurgitation. - Left atrium: The atrium was normal in size. - Right ventricle: Systolic function was normal. - Pulmonary arteries:  Systolic pressure was within the normal   range.   Past Medical History:  Diagnosis Date  . Family history of premature CAD    a. father diagnosed with CAD in his 5940s  . Frequent headaches   . Hypertension   . Palpitations    a. Lexiscan 11/2016: no sig ischemia, nl wall motion, EF > 55%, low risk scan; b. TTE 11/2016 EF 60-65%, nl wall motion, GR1DD, nl RV sys fxn, PASP nl   Family History  Problem Relation Age of Onset  . Arthritis Mother   . Hypertension Mother   . Diabetes Mother   . Arthritis Father   . Heart disease Father   . Hyperlipidemia Sister   . Kidney disease Maternal Aunt   . Breast cancer Maternal Aunt   . Thyroid cancer Maternal Aunt   . Breast cancer Paternal Aunt 238  . Prostate cancer Paternal Grandfather   . Aneurysm Other   . Colon cancer Neg Hx   . Stomach cancer Neg Hx   . Rectal cancer Neg Hx   . Esophageal cancer Neg Hx   . Liver cancer Neg Hx    Past Surgical History:  Procedure Laterality Date  . broken ankle     Patient Active Problem List   Diagnosis Date Noted  . Elevated blood pressure reading 05/16/2017  . Palpitations 09/25/2016  . Dysphagia 09/17/2016  . Hypertension 08/25/2016      Prior to Admission medications   Medication Sig Start Date End Date Taking? Authorizing Provider  acetaminophen (TYLENOL) 500 MG  tablet Take 500 mg by mouth as needed.    [provider]  furosemide (LASIX) 20 MG tablet Take 20 mg by mouth daily.    [provider]  lisinopril (PRINIVIL,ZESTRIL) 5 MG tablet Take 2 tablets (10 mg total) by mouth daily. Patient not taking: Reported on 12/23/2016 10/06/16   Allegra Grana, FNP  losartan (COZAAR) 100 MG tablet Take 100 mg by mouth daily.    [provider]    Allergies Penicillins    Social History Social History  Substance Use Topics  . Smoking status: Never Smoker  . Smokeless tobacco: Never Used  . Alcohol use No    Review of Systems Patient denies headaches,  rhinorrhea, blurry vision, numbness, shortness of breath, chest pain, edema, cough, abdominal pain, nausea, vomiting, diarrhea, dysuria, fevers, rashes or hallucinations unless otherwise stated above in HPI. ____________________________________________   PHYSICAL EXAM:  VITAL SIGNS: Vitals:   05/16/17 2316 05/16/17 2317  BP: (!) 177/93   Pulse:  93  Resp:  15  Temp:    SpO2:  100%    Constitutional: Alert and oriented. Well appearing and in no acute distress. Eyes: Conjunctivae are normal.  Head: Atraumatic. Nose: No congestion/rhinnorhea. Mouth/Throat: Mucous membranes are moist.   Neck: No stridor. Painless ROM.  Cardiovascular: Normal rate, regular rhythm. Grossly normal heart sounds.  Good peripheral circulation. Respiratory: Normal respiratory effort.  No retractions. Lungs CTAB. Gastrointestinal: Soft and nontender. No distention. No abdominal bruits. No CVA tenderness. Genitourinary:  Musculoskeletal: No lower extremity tenderness nor edema.  No joint effusions. Neurologic:  CN- intact.  No facial droop, Normal FNF.  Normal heel to shin.  Sensation intact bilaterally. Normal speech and language. No gross focal neurologic deficits are appreciated. No gait instability. Skin:  Skin is warm, dry and intact. No rash noted. Psychiatric: Mood and affect are normal. Speech and behavior are normal.  ____________________________________________   LABS (all labs ordered are listed, but only abnormal results are displayed)  Results for orders placed or performed during the hospital encounter of 05/16/17 (from the past 24 hour(s))  Basic metabolic panel     Status: Abnormal   Collection Time: 05/16/17 10:30 PM  Result Value Ref Range   Sodium 139 135 - 145 mmol/L   Potassium 3.4 (L) 3.5 - 5.1 mmol/L   Chloride 105 101 - 111 mmol/L   CO2 27 22 - 32 mmol/L   Glucose, Bld 94 65 - 99 mg/dL   BUN 20 6 - 20 mg/dL   Creatinine, Ser 6.96 (H) 0.44 - 1.00 mg/dL   Calcium 9.0 8.9 - 29.5  mg/dL   GFR calc non Af Amer >60 >60 mL/min   GFR calc Af Amer >60 >60 mL/min   Anion gap 7 5 - 15  CBC     Status: Abnormal   Collection Time: 05/16/17 10:30 PM  Result Value Ref Range   WBC 5.9 3.6 - 11.0 K/uL   RBC 4.23 3.80 - 5.20 MIL/uL   Hemoglobin 12.4 12.0 - 16.0 g/dL   HCT 28.4 13.2 - 44.0 %   MCV 84.3 80.0 - 100.0 fL   MCH 29.3 26.0 - 34.0 pg   MCHC 34.8 32.0 - 36.0 g/dL   RDW 10.2 (H) 72.5 - 36.6 %   Platelets 360 150 - 440 K/uL  Troponin I     Status: None   Collection Time: 05/16/17 10:30 PM  Result Value Ref Range   Troponin I <0.03 <0.03 ng/mL   ____________________________________________  EKG My review and personal interpretation at Time: 21:56   Indication: chest discomfort  Rate: 100  Rhythm: sinus Axis: normal Other: normal intervals, no stemi, unchanged from EKG 09/24/16 ____________________________________________  RADIOLOGY  I personally reviewed all radiographic images ordered to evaluate for the above acute complaints and reviewed radiology reports and findings.  These findings were personally discussed with the patient.  Please see medical record for radiology report.  ____________________________________________   PROCEDURES  Procedure(s) performed:  Procedures    Critical Care performed: no ____________________________________________   INITIAL IMPRESSION / ASSESSMENT AND PLAN / ED COURSE  Pertinent labs & imaging results that were available during my care of the patient were reviewed by me and considered in my medical decision making (see chart for details).  DDX: htn urgency, acs, chf, cva, Crist Infante,  SHERRYLL SKOCZYLAS is a 46 y.o. who presents to the ED with Non-distressed patient presenting with concern for elevated BP. Patient is AF,VSS with HTN in ED. Exam as above. Given current presentation have considered the above differential. no report of missed antihypertensive doses or medical non-compliance. no report of illicit drug use that  could elevate BP. Extensive evaluation of possible end organ damage pursued in ED. no evidence of acute renal dysfunction. Neuro exam without focal deficits. EKG without evidence of ischemia. Trop negative. Renal function nml. Not consistent with CHF, malignant htn, adrenergic crisis or hypertensive emergency.   Not clinically consistent with PE, dissection, or dehydration.  Will give trial low-dose beta-blocker as the patient is already on losartan and has not tolerated Norvasc or Lasix in the past.  Will have patient follow-up with her primary care physician, Dr. Allena Katz, on Monday for repeat blood pressure check.       ____________________________________________   FINAL CLINICAL IMPRESSION(S) / ED DIAGNOSES  Final diagnoses:  Hypertension, unspecified type  Epistaxis      NEW MEDICATIONS STARTED DURING THIS VISIT:  New Prescriptions   No medications on file     Note:  This document was prepared using Dragon voice recognition software and may include unintentional dictation errors.    Willy Eddy, MD 05/16/17 [phone]

## 2017-05-16 NOTE — ED Notes (Signed)
Patient c/o left wrist pain, left neck pain, headache, nausea, lightheadedness/dizziness beginning at 2100 today. Patient describes the pain as throbbing. Patient reports that her nose then began to bleed. Patient checked her blood pressure and it read 201 systolic on the right arm, and 197 systolic on the right arm.

## 2017-05-16 NOTE — ED Triage Notes (Signed)
Patient ambulatory to stat desk. Patient states that she has been having heart palpations times 30 minutes. Patient states that her blood pressure was 203/103 at home. Patient states that she has a history of hypertension and that it is normally well controlled with medication.

## 2017-05-17 NOTE — ED Notes (Signed)
Reviewed d/c instructions and follow-up care with patient. Patient verbalized understanding.

## 2017-05-18 ENCOUNTER — Telehealth: Payer: Self-pay | Admitting: Cardiology

## 2017-05-18 NOTE — Telephone Encounter (Signed)
Attempted to call patient  He was seen in ED on 05/16/17  Has seen Dr Alvino ChapelIngal and Eula Listenyan Dunn in past  Will try again later time

## 2017-05-20 ENCOUNTER — Ambulatory Visit (INDEPENDENT_AMBULATORY_CARE_PROVIDER_SITE_OTHER): Payer: PRIVATE HEALTH INSURANCE | Admitting: Internal Medicine

## 2017-05-20 ENCOUNTER — Encounter: Payer: Self-pay | Admitting: Internal Medicine

## 2017-05-20 ENCOUNTER — Other Ambulatory Visit
Admission: RE | Admit: 2017-05-20 | Discharge: 2017-05-20 | Disposition: A | Discharge status: Home / Self Care | Payer: PRIVATE HEALTH INSURANCE | Source: Ambulatory Visit | Attending: Internal Medicine | Admitting: Internal Medicine

## 2017-05-20 VITALS — BP 150/100 | HR 85 | Ht 63.0 in | Wt 258.8 lb

## 2017-05-20 DIAGNOSIS — R519 Headache, unspecified: Secondary | ICD-10-CM

## 2017-05-20 DIAGNOSIS — I10 Essential (primary) hypertension: Secondary | ICD-10-CM | POA: Diagnosis not present

## 2017-05-20 DIAGNOSIS — R002 Palpitations: Secondary | ICD-10-CM | POA: Diagnosis not present

## 2017-05-20 DIAGNOSIS — R030 Elevated blood-pressure reading, without diagnosis of hypertension: Secondary | ICD-10-CM | POA: Diagnosis present

## 2017-05-20 DIAGNOSIS — R51 Headache: Secondary | ICD-10-CM

## 2017-05-20 DIAGNOSIS — R0989 Other specified symptoms and signs involving the circulatory and respiratory systems: Secondary | ICD-10-CM

## 2017-05-20 LAB — BASIC METABOLIC PANEL
ANION GAP: 9 (ref 5–15)
BUN: 16 mg/dL (ref 6–20)
CHLORIDE: 99 mmol/L — AB (ref 101–111)
CO2: 28 mmol/L (ref 22–32)
Calcium: 9.2 mg/dL (ref 8.9–10.3)
Creatinine, Ser: 0.75 mg/dL (ref 0.44–1.00)
GFR calc non Af Amer: >60 mL/min (ref >60)
GLUCOSE: 88 mg/dL (ref 65–99)
Potassium: 3.8 mmol/L (ref 3.5–5.1)
Sodium: 136 mmol/L (ref 135–145)

## 2017-05-20 MED ORDER — AMLODIPINE BESYLATE 5 MG PO TABS: 5.0000 mg | ORAL_TABLET | Freq: Every day | ORAL | 3 refills | Status: DC

## 2017-05-20 NOTE — Progress Notes (Signed)
Follow-up Outpatient Visit Date: 05/20/2017  Primary Care Provider: System, Pcp Not In No address on file  Chief Complaint: palpitations  HPI:  Ms. Tara Newman is a 46 y.o. year-old female with history of hypertension and headaches, who presents for follow-up of palpitations and elevated blood pressure.  She was previously followed in our office by Dr. Alvino Chapel, having last been seen in March.  At that time, she reported chest pain, jaw tightness, and dyspnea on exertion, for which she was referred for pharmacologic myocardial perfusion stress test.  The study was low risk with a small fixed anteroseptal defect suggestive of breast attenuation.  LVEF was normal.  Echo also showed normal LV function with grade 1 diastolic dysfunction and mild mitral regurgitation.  Ms. Tara Newman was seen in the [facility] emergency department 4 days ago because of pain involving the left wrist as well as left-sided neck pain, lightheadedness, dizziness, and nausea.  She notes that her blood pressure was up after having eaten [place] [employer] earlier in the day.  Blood pressure in the ED was elevated at 177/93.  Low-dose beta-blocker was recommended, though she received only a single dose of metoprolol tartrate 12.5 mg. She reports having felt "unwell" most of last Saturday. She was soaking her feet at home after work and began coughing. She then noticed that her nose was bleeding and her heart seemed to be racing. She checked her HR/BP and noted a BP of 203/103 with a HR of 115 bpm. She notes labile hypertension that was first diagnosed in February of this year. She has been on several medications, including amlodipine, HCTZ, lisinopril, and losartan (current med) without adequate control. Ms. Tara Newman notes occasional headaches accompanying her palpitations and elevated BP. She denies prior workup for secondary  hypertension.  --------------------------------------------------------------------------------------------------  Cardiovascular History & Procedures: Cardiovascular Problems:  Chest pain and shortness of breath  Risk Factors:  Hypertension and obesity  Cath/PCI:  None  CV Surgery:  None  EP Procedures and Devices:  None  Non-Invasive Evaluation(s):  Pharmacologic MPI (12/19/16): Low risk study with small in size, mild in severity, fixed apical anterior and septal defect consistent with breast attenuation.  LVEF 55% with normal wall motion.  TTE (12/12/16): Normal LV size.  LVEF 55-60% with grade 1 diastolic dysfunction.  Mild MR.  Normal RV size and function.  Normal pulmonary artery pressure.  Recent CV Pertinent Labs: Lab Results  Component Value Date   CHOL 243 (H) 09/17/2016   HDL 46.60 09/17/2016   LDLCALC 169 (H) 09/17/2016   TRIG 138.0 09/17/2016   CHOLHDL 5 09/17/2016   K 3.4 (L) 05/16/2017   MG 2.3 09/25/2016   BUN 20 05/16/2017   CREATININE 1.06 (H) 05/16/2017    Past medical and surgical history were reviewed and updated in EPIC.  Current Meds  Medication Sig  . acetaminophen (TYLENOL) 500 MG tablet Take 500 mg by mouth as needed.  . furosemide (LASIX) 40 MG tablet Take 40 mg by mouth daily.   Marland Kitchen losartan (COZAAR) 100 MG tablet Take 100 mg by mouth daily.    Allergies: Lisinopril; Penicillins; and Amlodipine  Social History   Social History  . Marital status: Single    Spouse name: N/A  . Number of children: 1  . Years of education: N/A   Occupational History  . Diplomatic Services operational officer   Social History Main Topics  . Smoking status: Never Smoker  . Smokeless tobacco: Never Used  . Alcohol use No  .  Drug use: No  . Sexual activity: Not Currently   Other Topics Concern  . Not on file   Social History Narrative   Single. Has an adult daughter. She's a Print production plannertextile inspector at [employer]   No caffeine   09/22/2016        Family History  Problem Relation Age of Onset  . Arthritis Mother   . Hypertension Mother   . Diabetes Mother   . Arthritis Father   . Heart disease Father   . Hyperlipidemia Sister   . Kidney disease Maternal Aunt   . Breast cancer Maternal Aunt   . Thyroid cancer Maternal Aunt   . Breast cancer Paternal Aunt 1538  . Prostate cancer Paternal Grandfather   . Aneurysm Other   . Colon cancer Neg Hx   . Stomach cancer Neg Hx   . Rectal cancer Neg Hx   . Esophageal cancer Neg Hx   . Liver cancer Neg Hx     Review of Systems: A 12-system review of systems was performed and was negative except as noted in the HPI.  --------------------------------------------------------------------------------------------------  Physical Exam: Ht 5\' 3"  (1.6 m)   Wt 258 lb 12 oz (117.4 kg)   LMP 05/12/2017   BMI 45.84 kg/m   General:  Morbidly obese woman, seated comfortably on the exam table. HEENT: No conjunctival pallor or scleral icterus. Moist mucous membranes.  OP clear. Neck: Supple without lymphadenopathy or thyromegaly. JVP difficult to assess due to body habitus. Lungs: Normal work of breathing. Clear to auscultation bilaterally without wheezes or crackles. Heart: Distant heart sounds. Regular rate and rhythm without murmurs, rubs, or gallops. Unable to assess  Abd: Bowel sounds present. Soft, NT/ND without hepatosplenomegaly Ext: No lower extremity edema. Radial, PT, and DP pulses are 2+ bilaterally. Skin: Warm and dry without rash.  EKG: Normal sinus rhythm with borderline left atrial enlargement.  Nonspecific ST changes.  Lab Results  Component Value Date   WBC 5.9 05/16/2017   HGB 12.4 05/16/2017   HCT 35.6 05/16/2017   MCV 84.3 05/16/2017   PLT 360 05/16/2017    Lab Results  Component Value Date   NA 139 05/16/2017   K 3.4 (L) 05/16/2017   CL 105 05/16/2017   CO2 27 05/16/2017   BUN 20 05/16/2017   CREATININE 1.06 (H) 05/16/2017   GLUCOSE 94 05/16/2017   ALT  14 09/17/2016    Lab Results  Component Value Date   CHOL 243 (H) 09/17/2016   HDL 46.60 09/17/2016   LDLCALC 169 (H) 09/17/2016   TRIG 138.0 09/17/2016   CHOLHDL 5 09/17/2016    --------------------------------------------------------------------------------------------------  ASSESSMENT AND PLAN: Labile hypertension, palpitations, and headache Constellations of symptoms and elevated blood pressure are nonspecific.  Most likely, Ms. Tara Newman has poorly controlled essential hypertension.  However, given fairly recent onset of symptoms and hypertension, a secondary cause must be entertained.  In particular, her symptoms could be seen with a pheochromocytoma.  We have therefore agreed to obtain a 24-hour urine catecholamine and metanephrine assay.  I will also check serum aldosterone and plasma renin activities as well as basic metabolic panel given recent hypokalemia.  In light of her suboptimally controlled blood pressure today, we have agreed to add amlodipine 5 mg daily to her current dose of losartan.  I would like to avoid beta blockers for the time being until pheochromocytoma has been excluded.  Follow-up: Return to clinic in 1 month.  Yvonne Kendallhristopher Jonne Rote, MD 05/20/2017 10:58 AM

## 2017-05-20 NOTE — Patient Instructions (Signed)
Medication Instructions:  Your physician has recommended you make the following change in your medication:  1- START Amlodipine 5 mg (1 tablet) by mouth once a day.   Labwork: Your physician recommends that you return for lab work in: TODAY (BMP, Serum aldosterone/renin-activity, 24 hour urine for catecholamines and metanephrine)   - Please go to the [facility]. You will check in at the front desk to the right as you walk into the atrium. Valet Parking is offered if needed.    Testing/Procedures: none  Follow-Up: Your physician recommends that you schedule a follow-up appointment in: 1 MONTH WITH DR END.    If you need a refill on your cardiac medications before your next appointment, please call your pharmacy.

## 2017-05-21 ENCOUNTER — Encounter: Payer: Self-pay | Admitting: Internal Medicine

## 2017-05-22 ENCOUNTER — Telehealth: Payer: Self-pay | Admitting: Internal Medicine

## 2017-05-22 LAB — CATECHOLAMINES, FRACTIONATED, PLASMA: Norepinephrine: 915 pg/mL — ABNORMAL HIGH (ref 0–874)

## 2017-05-22 NOTE — Telephone Encounter (Signed)
S/w patient.She stated her pharmacist said she should discuss with her doctor when to take her medications. We discussed and timing of medications and she will take her amlodipine in the evening and furosemide and losartan in the morning. Patient also advised to take her 24 hour urine collection to the [facility] lab tomorrow morning and that someone should be there to check her in.

## 2017-05-22 NOTE — Telephone Encounter (Signed)
Pt c/o medication issue:  1. Name of Medication: amplodipine 5 mg po daily   2. How are you currently taking this medication (dosage and times per day)? See above  3. Are you having a reaction (difficulty breathing--STAT)? No   4. What is your medication issue?  Patient doesn't know if she should take at the same time as othe bp med previously rxd   Patient also has a 24 hr urine collection and wants to know what to do with specimen in the morning

## 2017-05-23 ENCOUNTER — Other Ambulatory Visit
Admission: RE | Admit: 2017-05-23 | Discharge: 2017-05-23 | Disposition: A | Discharge status: Home / Self Care | Payer: PRIVATE HEALTH INSURANCE | Source: Ambulatory Visit | Attending: Internal Medicine | Admitting: Internal Medicine

## 2017-05-23 DIAGNOSIS — R030 Elevated blood-pressure reading, without diagnosis of hypertension: Secondary | ICD-10-CM | POA: Diagnosis present

## 2017-05-23 DIAGNOSIS — I10 Essential (primary) hypertension: Secondary | ICD-10-CM | POA: Insufficient documentation

## 2017-05-23 LAB — ALDOSTERONE + RENIN ACTIVITY W/ RATIO
ALDO / PRA Ratio: 2 (ref 0.0–30.0)
Aldosterone: 4.7 ng/dL (ref 0.0–30.0)
PRA LC/MS/MS: 2.379 ng/mL/hr (ref 0.167–5.380)

## 2017-05-27 LAB — METANEPHRINES, URINE, 24 HOUR
Metaneph Total, Ur: 60 ug/L
Metanephrines, 24H Ur: 72 ug/24 hr (ref 45–290)
NORMETANEPHRINE UR: 478 ug/L
Normetanephrine, 24H Ur: 574 ug/24 hr — ABNORMAL HIGH (ref 82–500)

## 2017-07-07 ENCOUNTER — Ambulatory Visit: Payer: PRIVATE HEALTH INSURANCE | Admitting: Internal Medicine

## 2017-07-22 ENCOUNTER — Ambulatory Visit (INDEPENDENT_AMBULATORY_CARE_PROVIDER_SITE_OTHER): Payer: PRIVATE HEALTH INSURANCE | Admitting: Physician Assistant

## 2017-07-22 ENCOUNTER — Encounter: Payer: Self-pay | Admitting: Physician Assistant

## 2017-07-22 ENCOUNTER — Other Ambulatory Visit: Payer: Self-pay

## 2017-07-22 VITALS — BP 166/84 | HR 96 | Ht 63.0 in | Wt 264.2 lb

## 2017-07-22 DIAGNOSIS — R0683 Snoring: Secondary | ICD-10-CM

## 2017-07-22 DIAGNOSIS — J069 Acute upper respiratory infection, unspecified: Secondary | ICD-10-CM | POA: Diagnosis not present

## 2017-07-22 DIAGNOSIS — R002 Palpitations: Secondary | ICD-10-CM

## 2017-07-22 DIAGNOSIS — I10 Essential (primary) hypertension: Secondary | ICD-10-CM | POA: Diagnosis not present

## 2017-07-22 DIAGNOSIS — I701 Atherosclerosis of renal artery: Secondary | ICD-10-CM

## 2017-07-22 NOTE — Progress Notes (Signed)
Cardiology Office Note Date:  07/22/2017  Patient ID:  Tara Newman, DOB July 02, 1971, MRN [id number] PCP:  System, Pcp Not In  Cardiologist:  Dr. Okey DupreEnd, MD    Chief Complaint: Follow up  History of Present Illness: Tara Newman is a 46 y.o. female with history of palpitations, labile HTN, frequent headaches, and family history of premature CAD who presents for follow up of her HTN.   She was previously followed in our office by Dr. Alvino ChapelIngal and underwent pharmacologic myocardial perfusion stress test in 11/2016 for chest pain and dyspnea. The study was low risk with a small fixed anteroseptal defect suggestive of breast attenuation. LVEF was normal. Echo in 11/2016 also showed normal LV function with grade 1 diastolic dysfunction and mild mitral regurgitation. She was most recently seen by Dr. Okey DupreEnd on 10/24 after having been seen in the ED on 10/20 for pain involving the left wrist as well as left-sided neck pain, lightheadedness, dizziness, and nausea. She noted that her blood pressure was up after having eaten [place] [employer] earlier in the day. Blood pressure in the ED was elevated at 177/93. She was given a one time dose of metoprolol in the ED. In follow up on 10/24 her BP remained elevated at 150 systolic. It was noted at that time she had previously been on amlodipine, HCTZ and lisinopril. She was taking losartan and Lasix. Given her elevated BP, amlodipine was added to her losartan. Secondary hypertension work up showed renin/aldosterone levels were normal. 24 hour urine showed a mildly elevated normetanephrine.   She comes in today noting continued elevations in her blood pressure at home, running from the 140s to 160s systolic. She notes intermittent palpitations, mostly at nighttime when laying flat on her back. These episodes of palpitations occur maybe once per week, if that often and last several minutes and self resolve. She is no longer taking the amlodipine that was  prescribed to her at her last visit 2/2 palpitations. She is taking losartan 100 mg daily and Lasix 40 mg daily. She does have a long history of snoring according to the patient and her significant other and has never had a sleep study. No prior renal artery ultrasound. Her mother has HTN, uncertain of her age at diagnosis. The patient denies any tobacco abuse, etoh abuse, or illegal drugs. No chest pain.    Past Medical History:  Diagnosis Date  . Family history of premature CAD    a. father diagnosed with CAD in his 1140s  . Frequent headaches   . Hypertension   . Palpitations    a. Lexiscan 11/2016: no sig ischemia, nl wall motion, EF > 55%, low risk scan; b. TTE 11/2016 EF 60-65%, nl wall motion, GR1DD, nl RV sys fxn, PASP nl    Past Surgical History:  Procedure Laterality Date  . broken ankle      Current Meds  Medication Sig  . acetaminophen (TYLENOL) 500 MG tablet Take 500 mg by mouth as needed.  . furosemide (LASIX) 40 MG tablet Take 40 mg by mouth daily.   Marland Kitchen. losartan (COZAAR) 100 MG tablet Take 100 mg by mouth daily.    Allergies:   Lisinopril; Penicillins; and Amlodipine   Social History:  The patient  reports that  has never smoked. she has never used smokeless tobacco. She reports that she does not drink alcohol or use drugs.   Family History:  The patient's family history includes Aneurysm in her other; Arthritis in her father  and mother; Breast cancer in her maternal aunt; Breast cancer (age of onset: 3238) in her paternal aunt; Diabetes in her mother; Heart disease in her father; Hyperlipidemia in her sister; Hypertension in her mother; Kidney disease in her maternal aunt; Prostate cancer in her paternal grandfather; Thyroid cancer in her maternal aunt.  ROS:   Review of Systems  Constitutional: Positive for malaise/fatigue. Negative for chills, diaphoresis, fever and weight loss.  HENT: Positive for congestion.   Eyes: Negative for discharge and redness.  Respiratory:  Negative for cough, hemoptysis, sputum production, shortness of breath and wheezing.   Cardiovascular: Positive for palpitations. Negative for chest pain, orthopnea, claudication, leg swelling and PND.  Gastrointestinal: Negative for abdominal pain, blood in stool, heartburn, melena, nausea and vomiting.  Genitourinary: Negative for hematuria.  Musculoskeletal: Negative for falls and myalgias.  Skin: Negative for rash.  Neurological: Negative for dizziness, tingling, tremors, sensory change, speech change, focal weakness, loss of consciousness and weakness.  Endo/Heme/Allergies: Does not bruise/bleed easily.  Psychiatric/Behavioral: Negative for substance abuse. The patient is nervous/anxious.   All other systems reviewed and are negative.    PHYSICAL EXAM:  VS:  BP (!) 166/84 (BP Location: Right Arm, Patient Position: Sitting, Cuff Size: Normal)   Pulse 96   Ht 5\' 3"  (1.6 m)   Wt 264 lb 4 oz (119.9 kg)   BMI 46.81 kg/m  BMI: Body mass index is 46.81 kg/m.  Physical Exam  Constitutional: She is oriented to person, place, and time. She appears well-developed and well-nourished.  HENT:  Head: Normocephalic and atraumatic.  Eyes: Right eye exhibits no discharge. Left eye exhibits no discharge.  Neck: Normal range of motion. No JVD present.  Cardiovascular: Normal rate, regular rhythm, S1 normal, S2 normal and normal heart sounds. Exam reveals no distant heart sounds, no friction rub, no midsystolic click and no opening snap.  No murmur heard. Pulses:      Posterior tibial pulses are 2+ on the right side, and 2+ on the left side.  Pulmonary/Chest: Effort normal and breath sounds normal. No respiratory distress. She has no decreased breath sounds. She has no wheezes. She has no rales. She exhibits no tenderness.  Abdominal: Soft. She exhibits no distension. There is no tenderness.  Musculoskeletal: She exhibits no edema.  Neurological: She is alert and oriented to person, place, and  time.  Skin: Skin is warm and dry. No cyanosis. Nails show no clubbing.  Psychiatric: She has a normal mood and affect. Her speech is normal and behavior is normal. Judgment and thought content normal.     EKG:  Was ordered and interpreted by me today. Shows NSR, 96 bpm, nonspecific st/t changes   Recent Labs: 09/17/2016: ALT 14 09/25/2016: Magnesium 2.3; TSH 6.41 05/16/2017: Hemoglobin 12.4; Platelets 360 05/20/2017: BUN 16; Creatinine, Ser 0.75; Potassium 3.8; Sodium 136  09/17/2016: Cholesterol 243; HDL 46.60; LDL Cholesterol 169; Total CHOL/HDL Ratio 5; Triglycerides 138.0; VLDL 27.6   CrCl cannot be calculated (Patient's most recent lab result is older than the maximum 21 days allowed.).   Wt Readings from Last 3 Encounters:  07/22/17 264 lb 4 oz (119.9 kg)  05/20/17 258 lb 12 oz (117.4 kg)  05/16/17 260 lb (117.9 kg)     Other studies reviewed: Additional studies/records reviewed today include: summarized above  ASSESSMENT AND PLAN:  1. Poorly controlled hypertension: Long discussion with the patient today regarding further workup and treatment options. I do recommend she undergo sleep study evaluation given her body habitus, snoring,  daytime somnolence, and poorly controlled hypertension. I also recommend she undergo renal artery ultrasound to evaluate for RAS. I discussed with her various medication changes including changing losartan to irbesartan and/or a trial of diltiazem. We even discussed other hypertensive medications. The patient refuses to make any medication changes at this time given the difference in mg from her current medications. I have discussed with her in detail the dangers of poorly controlled hypertension. She is aware of all risks, including death, and wants to "research online." She will be losing her health insurance on 07/28/17 and would like to have her sleep study and renal artery ultrasound before then. I made the schedulers aware of this request, though did  not promise the patient this could be done by then. She will contact our office after she has done her research to let us know if she would like to adjust her medications. I will defer further workup of her mildly abnormal 24 hour urine to Dr. Okey Dupre.   2. Palpitations: Consider outpatient cardiac monitoring. Would ideally perform cardiac monitoring while not on diltiazem. She will let us know if she would like to pursue this after reviewing medications "online."   3. Suspected anxiety: Recommend she follow up with PCP.   4. Obesity/snoring: Schedule sleep study as above.   5. URI: Advised Mucinex.   Disposition: F/u with Dr. Okey Dupre in 1 month.   Current medicines are reviewed at length with the patient today.  The patient did not have any concerns regarding medicines.  Elinor Dodge PA-C 07/22/2017 3:46 PM     [facility] [address] [place], [place] [address] [phone]

## 2017-07-22 NOTE — Addendum Note (Signed)
Addended by: Sherri RadMCGHEE, Merle Cirelli C on: 07/22/2017 04:16 PM   Modules accepted: Orders

## 2017-07-22 NOTE — Patient Instructions (Addendum)
Medication Instructions: - Your physician recommends that you continue on your current medications as directed. Please refer to the Current Medication list given to you today  Labwork: - none ordered  Procedures/Testing: - Your physician has requested that you have a renal artery duplex. During this test, an ultrasound is used to evaluate blood flow to the kidneys. Allow one hour for this exam. Do not eat after midnight the day before and avoid carbonated beverages. Take your medications as you usually do.  Follow-Up: - You have been referred to [facility]/ Dr. Armanda Magicraci Turner for evaluation of sleep apnea (1st available)  - Your physician recommends that you schedule a follow-up appointment in: 1 month with Dr. Okey DupreEnd   Any Additional Special Instructions Will Be Listed Below (If Applicable). - please research Irbesartan & Diltiazem    If you need a refill on your cardiac medications before your next appointment, please call your pharmacy.

## 2017-07-24 ENCOUNTER — Ambulatory Visit
Admission: RE | Admit: 2017-07-24 | Discharge: 2017-07-24 | Disposition: A | Discharge status: Home / Self Care | Payer: PRIVATE HEALTH INSURANCE | Source: Ambulatory Visit | Attending: Physician Assistant | Admitting: Physician Assistant

## 2017-07-24 DIAGNOSIS — I701 Atherosclerosis of renal artery: Secondary | ICD-10-CM | POA: Diagnosis not present

## 2017-08-03 ENCOUNTER — Telehealth: Payer: Self-pay | Admitting: Internal Medicine

## 2017-08-03 NOTE — Telephone Encounter (Signed)
Patient is returning call Believes it may be for results Patient will be in tomorrow for appt with Pulm but may want to speak with nurse when she arrives if possible

## 2017-08-03 NOTE — Telephone Encounter (Signed)
No answer. Left message to call back.   

## 2017-08-04 ENCOUNTER — Encounter: Payer: Self-pay | Admitting: Internal Medicine

## 2017-08-04 ENCOUNTER — Ambulatory Visit (INDEPENDENT_AMBULATORY_CARE_PROVIDER_SITE_OTHER): Payer: Self-pay | Admitting: Internal Medicine

## 2017-08-04 VITALS — BP 160/90 | HR 101 | Resp 16 | Ht 63.0 in | Wt 265.0 lb

## 2017-08-04 DIAGNOSIS — G4719 Other hypersomnia: Secondary | ICD-10-CM

## 2017-08-04 NOTE — Progress Notes (Signed)
[facility] Consultation      Assessment and Plan:  Snoring with Excessive daytime sleepiness, morning headaches, and difficulty concentrating.  --Will send for sleep study.    GERD.  --Discussed dietary restriction.    Date: 08/04/2017  MRN# [id number] Tara NewcomerDonna K Newman March 29, 1971   Tara Newman:    Chief Complaint  Patient presents with  . Advice Only    Referred by Tara Newman for snoring:    HPI:   Patient is a 47 year old female, she was recently seen in the cardiology clinic for labile hypertension, and is referred here for evaluation Newman possible obstructive sleep apnea. He typically goes to bed between 9 PM and 10 PM, she falls asleep within 30 minutes.  She usually gets out Newman bed at 5:30 AM.   She snores during sleep, and is sleepy during the day. No known witness apneas. She does fall asleep easily while trying to do school work. Difficulty concentrating.  No cataplexy.  She has trouble sleeping flat on her back because Newman choking sensation. Her mom is on cpap.    PMHX:   Past Medical History:  Diagnosis Date  . Family history Newman premature CAD    a. father diagnosed with CAD in his 3540s  . Frequent headaches   . Hypertension   . Palpitations    a. Lexiscan 11/2016: no sig ischemia, nl wall motion, EF > 55%, low risk scan; b. TTE 11/2016 EF 60-65%, nl wall motion, GR1DD, nl RV sys fxn, PASP nl   Surgical Hx:  Past Surgical History:  Procedure Laterality Date  . broken ankle     Family Hx:  Family History  Problem Relation Age Newman Onset  . Arthritis Mother   . Hypertension Mother   . Diabetes Mother   . Arthritis Father   . Heart disease Father   . Hyperlipidemia Sister   . Kidney disease Maternal Aunt   . Breast cancer Maternal Aunt   . Thyroid cancer Maternal Aunt   . Breast cancer Paternal Aunt 3738  . Prostate cancer Paternal Grandfather   . Aneurysm Other   .  Colon cancer Neg Hx   . Stomach cancer Neg Hx   . Rectal cancer Neg Hx   . Esophageal cancer Neg Hx   . Liver cancer Neg Hx    Social Hx:   Social History   Tobacco Use  . Smoking status: Never Smoker  . Smokeless tobacco: Never Used  Substance Use Topics  . Alcohol use: No  . Drug use: No   Medication:    Current Outpatient Medications:  .  acetaminophen (TYLENOL) 500 MG tablet, Take 500 mg by mouth as needed., Disp: , Rfl:  .  furosemide (LASIX) 40 MG tablet, Take 40 mg by mouth daily. , Disp: , Rfl:  .  losartan (COZAAR) 100 MG tablet, Take 100 mg by mouth daily., Disp: , Rfl:    Allergies:  Lisinopril; Penicillins; and Amlodipine  Review Newman Systems: Gen:  Denies  fever, sweats, chills HEENT: Denies blurred vision, double vision. bleeds, sore throat Cvc:  No dizziness, chest pain. Resp:   Denies cough or sputum production, shortness Newman breath Gi: Denies swallowing difficulty, stomach pain. Gu:  Denies bladder incontinence, burning urine Ext:   No Joint pain, stiffness. Skin: No skin rash,  hives  Endoc:  No polyuria, polydipsia. Psych: No depression, insomnia. Other:  All other systems  were reviewed with the patient and were negative other that what is mentioned in the HPI.   Physical Examination:   VS: BP (!) 160/90 (BP Location: Left Arm, Cuff Size: Large)   Pulse (!) 101   Resp 16   Ht 5\' 3"  (1.6 m)   Wt 265 lb (120.2 kg)   SpO2 95%   BMI 46.94 kg/m   General Appearance: No distress  Neuro:without focal findings,  speech normal,  HEENT: PERRLA, EOM intact.  [place] 3.  Pulmonary: normal breath sounds, No wheezing.  CardiovascularNormal S1,S2.  No m/r/g.   Abdomen: Benign, Soft, non-tender. Renal:  No costovertebral tenderness  GU:  No performed at this time. Endoc: No evident thyromegaly, no signs Newman acromegaly. Skin:   warm, no rashes, no ecchymosis  Extremities: normal, no cyanosis, clubbing.  Other findings:    LABORATORY PANEL:   CBC No  results for input(s): WBC, HGB, HCT, PLT in the last 168 hours. ------------------------------------------------------------------------------------------------------------------  Chemistries  No results for input(s): NA, K, CL, CO2, GLUCOSE, BUN, CREATININE, CALCIUM, MG, AST, ALT, ALKPHOS, BILITOT in the last 168 hours.  Invalid input(s): GFRCGP ------------------------------------------------------------------------------------------------------------------  Cardiac Enzymes No results for input(s): TROPONINI in the last 168 hours. ------------------------------------------------------------  RADIOLOGY:  No results found.     Thank  you for the consultation and for allowing [facility] to assist in the care Newman your patient. Our recommendations are noted above.  Please contact us if we can be Newman further service.   Wells Guiles, MD.  Board Certified in Internal Medicine, Pulmonary Medicine, Critical Care Medicine, and Sleep Medicine.  [facility] Office Number: [phone]  Santiago Glad, M.D.  Billy Fischer, M.D  08/04/2017

## 2017-08-04 NOTE — Telephone Encounter (Signed)
Patient saw Dr Nicholos Johnsamachandran today at Pulmonary. No answer. Left message to call us back if she has any further questions.

## 2017-08-04 NOTE — Patient Instructions (Signed)

## 2017-08-11 ENCOUNTER — Encounter: Payer: Self-pay | Admitting: Internal Medicine

## 2017-08-11 NOTE — Telephone Encounter (Signed)
This encounter was created in error - please disregard.

## 2017-08-12 ENCOUNTER — Encounter: Payer: Self-pay | Admitting: Internal Medicine

## 2017-08-12 ENCOUNTER — Other Ambulatory Visit
Admission: RE | Admit: 2017-08-12 | Discharge: 2017-08-12 | Disposition: A | Discharge status: Home / Self Care | Payer: PRIVATE HEALTH INSURANCE | Source: Ambulatory Visit | Attending: Internal Medicine | Admitting: Internal Medicine

## 2017-08-12 ENCOUNTER — Ambulatory Visit: Payer: PRIVATE HEALTH INSURANCE | Admitting: Internal Medicine

## 2017-08-12 VITALS — BP 140/80 | HR 78 | Ht 63.0 in | Wt 262.0 lb

## 2017-08-12 DIAGNOSIS — I10 Essential (primary) hypertension: Secondary | ICD-10-CM | POA: Insufficient documentation

## 2017-08-12 DIAGNOSIS — E785 Hyperlipidemia, unspecified: Secondary | ICD-10-CM

## 2017-08-12 DIAGNOSIS — R002 Palpitations: Secondary | ICD-10-CM

## 2017-08-12 DIAGNOSIS — R252 Cramp and spasm: Secondary | ICD-10-CM

## 2017-08-12 LAB — BASIC METABOLIC PANEL
ANION GAP: 9 (ref 5–15)
BUN: 18 mg/dL (ref 6–20)
CHLORIDE: 102 mmol/L (ref 101–111)
CO2: 28 mmol/L (ref 22–32)
Calcium: 9.4 mg/dL (ref 8.9–10.3)
Creatinine, Ser: 0.81 mg/dL (ref 0.44–1.00)
GFR calc Af Amer: >60 mL/min (ref >60)
GFR calc non Af Amer: >60 mL/min (ref >60)
GLUCOSE: 95 mg/dL (ref 65–99)
POTASSIUM: 4 mmol/L (ref 3.5–5.1)
Sodium: 139 mmol/L (ref 135–145)

## 2017-08-12 LAB — MAGNESIUM: Magnesium: 2.4 mg/dL (ref 1.7–2.4)

## 2017-08-12 MED ORDER — CARVEDILOL 3.125 MG PO TABS: 3.1250 mg | ORAL_TABLET | Freq: Two times a day (BID) | ORAL | 3 refills | Status: DC

## 2017-08-12 NOTE — Progress Notes (Signed)
Follow-up Outpatient Visit Date: 08/12/2017  Primary Care Provider: System, Pcp Not In No address on file  Chief Complaint: Follow-up hypertension and palpitations  HPI:  Tara Newman is a 47 y.o. year-old female with history of tension, who presents for follow-up of hypertension and palpitations.  I last saw her in October, at which time she reported elevated blood pressures associated with left arm pain and palpitations.  We subsequently agreed to perform workup for secondary hypertension including serum aldosterone/plasma renin activity and urine catecholamines/metanephrines.  24-hour urine collection showed minimally elevated normetanephrine and norepinephrine.  Other fractionated catecholamines and metanephrines were normal.  She was evaluated last month by Eula Listen, PA, it was noted to have uncontrolled hypertension still at home.  She also complained of palpitations.  Tara Newman refused to make any medication changes at the time.  She also declined event monitoring.  Today, Tara Newman notes that she is currently recovering from a recent upper respiratory tract infection.  Blood pressure at home is still elevated typically around 160/90.  She continues to have sporadic palpitations.  She is also having intermittent heartburn and palpitations that occurs throughout the day.  Seems to happen most commonly after she eats and drinks water.  The heartburn and belching usually last about 5 minutes and then resolve spontaneously.  She otherwise denies chest pain and shortness of breath.  She has mild leg edema but notes that she has not taken her fluid pill today.  Insurance expired at the [place] [employer] of December.  She was able to undergo renal Doppler that showed no significant stenosis.  However, sleep study could not be scheduled.  --------------------------------------------------------------------------------------------------  Past Medical History:  Diagnosis Date  . Family history of  premature CAD    a. father diagnosed with CAD in his 93s  . Frequent headaches   . Hypertension   . Palpitations    a. Lexiscan 11/2016: no sig ischemia, nl wall motion, EF > 55%, low risk scan; b. TTE 11/2016 EF 60-65%, nl wall motion, GR1DD, nl RV sys fxn, PASP nl   Past Surgical History:  Procedure Laterality Date  . broken ankle      Current Meds  Medication Sig  . acetaminophen (TYLENOL) 500 MG tablet Take 500 mg by mouth as needed.  . furosemide (LASIX) 40 MG tablet Take 40 mg by mouth daily.   Marland Kitchen losartan (COZAAR) 100 MG tablet Take 100 mg by mouth daily.    Allergies: Lisinopril; Penicillins; and Amlodipine  Social History   Socioeconomic History  . Marital status: Single    Spouse name: Not on file  . Number of children: 1  . Years of education: Not on file  . Highest education level: Not on file  Social Needs  . Financial resource strain: Not on file  . Food insecurity - worry: Not on file  . Food insecurity - inability: Not on file  . Transportation needs - medical: Not on file  . Transportation needs - non-medical: Not on file  Occupational History  . Occupation: Dispensing optician: [employer]  Tobacco Use  . Smoking status: Never Smoker  . Smokeless tobacco: Never Used  Substance and Sexual Activity  . Alcohol use: No  . Drug use: No  . Sexual activity: Not Currently  Other Topics Concern  . Not on file  Social History Narrative   Single. Has an adult daughter. She's a Print production planner at [employer]   No caffeine   09/22/2016  Family History  Problem Relation Age of Onset  . Arthritis Mother   . Hypertension Mother   . Diabetes Mother   . Arthritis Father   . Heart disease Father   . Hyperlipidemia Sister   . Kidney disease Maternal Aunt   . Breast cancer Maternal Aunt   . Thyroid cancer Maternal Aunt   . Breast cancer Paternal Aunt 39  . Prostate cancer Paternal Grandfather   . Aneurysm Other   . Colon cancer Neg Hx     . Stomach cancer Neg Hx   . Rectal cancer Neg Hx   . Esophageal cancer Neg Hx   . Liver cancer Neg Hx     Review of Systems: Tara Newman notes intermittent muscle cramps especially at night.  Otherwise, a 12-system review of systems was performed and was negative except as noted in the HPI.  --------------------------------------------------------------------------------------------------  Physical Exam: BP 140/80 (BP Location: Left Arm, Patient Position: Sitting, Cuff Size: Large)   Pulse 78   Ht 5\' 3"  (1.6 m)   Wt 262 lb (118.8 kg)   BMI 46.41 kg/m   General: Morbidly obese woman seated comfortably in the exam room. HEENT: No conjunctival pallor or scleral icterus. Moist mucous membranes.  OP clear. Neck: Supple without lymphadenopathy, thyromegaly, JVD, or HJR by body habitus. Lungs: Normal work of breathing. Clear to auscultation bilaterally without wheezes or crackles. Heart: Regular rate and rhythm without murmurs, rubs, or gallops.  Unable to assess PMI due to body habitus. Abd: Bowel sounds present. Soft, NT/ND.  Unable to assess HSM due to body habitus. Ext: No significant lower extremity edema bilaterally. Skin: Warm and dry without rash.  EKG: Normal sinus rhythm without abnormalities.  Lab Results  Component Value Date   WBC 5.9 05/16/2017   HGB 12.4 05/16/2017   HCT 35.6 05/16/2017   MCV 84.3 05/16/2017   PLT 360 05/16/2017    Lab Results  Component Value Date   NA 136 05/20/2017   K 3.8 05/20/2017   CL 99 (L) 05/20/2017   CO2 28 05/20/2017   BUN 16 05/20/2017   CREATININE 0.75 05/20/2017   GLUCOSE 88 05/20/2017   ALT 14 09/17/2016    Lab Results  Component Value Date   CHOL 243 (H) 09/17/2016   HDL 46.60 09/17/2016   LDLCALC 169 (H) 09/17/2016   TRIG 138.0 09/17/2016   CHOLHDL 5 09/17/2016    --------------------------------------------------------------------------------------------------  ASSESSMENT AND PLAN: Hypertension Blood  pressure is borderline elevated today but seems to be running high at home.  She is currently on losartan and furosemide, which she is tolerating well.  Given palpitations, we have agreed to start carvedilol 3.125 mg twice daily.  Though her urine norepinephrine and normetanephrine slightly elevated, I do not think that this truly represents pheochromocytoma.  If her blood pressure remains difficult to control, we could consider obtaining serum catecholamines/metanephrines.  Serum aldosterone to plasma renin activity ratio and renal artery Doppler were normal.  Certainly, evaluation for sleep apnea is warranted.  This may have to be deferred, however, until Ms. Kaser is able to procure insurance.  Palpitations Still happening frequently but without worrisome symptoms.  We will see if she has any symptomatic improvement with the addition of carvedilol.  Hyperlipidemia LDL noted to be moderately elevated at 169 last year.  I encouraged Ms. Min to lose weight and modify her diet.  Cramps Most pronounced later in the day or at night.  I will check a basic metabolic panel and magnesium  level today.  We will have Ms. Brailsford take furosemide on an as-needed basis if she has swelling or weight gain, as dehydration could be contributing to her cramps.  Follow-up: Return to clinic in 6 weeks.  Yvonne Kendallhristopher Ezariah Nace, MD 08/12/2017 3:16 PM

## 2017-08-12 NOTE — Patient Instructions (Signed)
Medication Instructions:  Your physician has recommended you make the following change in your medication:  1- START Carvedilol 3.125 mg by mouth two times a day.  After 1-2 weeks, if your Blood pressure continues to run greater than 140/90 along with your heart rate at rest being greater than or equal to 65, then increase Carvedilol to 6.25 mg by mouth two times a day.   2. TAKE Furosemide daily as needed for swelling or weight gain.   Labwork: Your physician recommends that you return for lab work in: TODAY (BMET, Mg). - [facility] You will check in at the front desk to the right as you walk into the atrium. Valet Parking is offered if needed.    Testing/Procedures: none  Follow-Up: Your physician recommends that you schedule a follow-up appointment in: 6 WEEKS WITH DR END OR APP.   If you need a refill on your cardiac medications before your next appointment, please call your pharmacy.

## 2017-08-13 ENCOUNTER — Encounter: Payer: Self-pay | Admitting: Internal Medicine

## 2017-08-13 DIAGNOSIS — E785 Hyperlipidemia, unspecified: Secondary | ICD-10-CM | POA: Insufficient documentation

## 2017-08-18 ENCOUNTER — Other Ambulatory Visit: Payer: Self-pay | Admitting: *Deleted

## 2017-08-18 NOTE — Addendum Note (Signed)
Addended by: Stann MainlandLARK, Aveyah Greenwood O on: 08/18/2017 08:03 AM   Modules accepted: Orders

## 2017-09-09 ENCOUNTER — Encounter: Payer: Self-pay | Admitting: Internal Medicine

## 2017-09-09 DIAGNOSIS — G4719 Other hypersomnia: Secondary | ICD-10-CM

## 2017-09-09 DIAGNOSIS — G4733 Obstructive sleep apnea (adult) (pediatric): Secondary | ICD-10-CM

## 2017-09-11 DIAGNOSIS — G4733 Obstructive sleep apnea (adult) (pediatric): Secondary | ICD-10-CM

## 2017-09-14 ENCOUNTER — Telehealth: Payer: Self-pay | Admitting: *Deleted

## 2017-09-14 DIAGNOSIS — G4733 Obstructive sleep apnea (adult) (pediatric): Secondary | ICD-10-CM

## 2017-09-14 NOTE — Telephone Encounter (Signed)
Pt aware of sleep study. Orders placed for sleep study. Nothing further needed.

## 2017-09-16 NOTE — Progress Notes (Deleted)
Cardiology Office Note Date:  09/16/2017  Patient ID:  Tara NewcomerDonna K Cephas, DOB [birth year], MRN [id number] PCP:  System, Pcp Not In  Cardiologist:  Dr. Okey DupreEnd, MD  ***refresh   Chief Complaint: Follow up  History of Present Illness: Tara Newman is a 47 y.o. female with history of palpitations, labile HTN, frequent headaches, and family history of premature CAD who presents for follow up of her HTN.   She was previously followed in our office by Dr. Alvino ChapelIngal and underwent pharmacologic myocardial perfusion stress test in 11/2016 for chest pain and dyspnea. The study was low risk with a small fixed anteroseptal defect suggestive of breast attenuation. LVEF was normal. Echo in 11/2016 also showed normal LV function with grade 1 diastolic dysfunction and mild mitral regurgitation. She has since re-established with Dr. Okey DupreEnd, seeing him on 10/24 after having been seen in the ED on 10/20 for pain involving the left wrist as well as left-sided neck pain, lightheadedness, dizziness, and nausea. She noted that her blood pressure was up after having eaten [employer] [employer] earlier in the day. Blood pressure in the ED was elevated at 177/93. She was given a one time dose of metoprolol in the ED. In follow up on 10/24 her BP remained elevated at 150 systolic. It was noted at that time she had previously been on amlodipine, HCTZ and lisinopril. She was taking losartan and Lasix. Given her elevated BP, amlodipine was added to her losartan. Secondary hypertension work up showed renin/aldosterone levels were normal. 24 hour urine showed a mildly elevated normetanephrine. She was seen in follow up on 12/26 and continued to note elevated BP at home with readings running from the 140s to 160s systolic. She also noted intermittent palpitations, mostly at nighttime when laying flat on her back. She was no longer taking amlodipine 2/2 palpitations. She was taking losartan 100 mg daily and Lasix 40 mg daily. She also  reported a long history of snoring. She refused to make any medications changes at that time, wanting to research online. She refused cardiac monitoring as well. Renal artery ultrasound showed no significant stenosis. She was seen by Dr. Okey DupreEnd on 08/12/17 with continued elevation of her BP in the 160s/90s at home. Office BP was noted to be 140/80. She was recovering from a URI. She continued to note intermittent palpitations and heartburn. She was started on Coreg 3.125 mg bid. Labs showed potassium and magnesium were at goal with normal SCr. Sleep study is pending.  ***   Past Medical History:  Diagnosis Date  . Family history of premature CAD    a. father diagnosed with CAD in his 6640s  . Frequent headaches   . Hypertension   . Palpitations    a. Lexiscan 11/2016: no sig ischemia, nl wall motion, EF > 55%, low risk scan; b. TTE 11/2016 EF 60-65%, nl wall motion, GR1DD, nl RV sys fxn, PASP nl    Past Surgical History:  Procedure Laterality Date  . broken ankle      No outpatient medications have been marked as taking for the 09/23/17 encounter (Appointment) with Sondra Bargesunn, Jisele Price M, PA-C.    Allergies:   Lisinopril; Penicillins; and Amlodipine   Social History:  The patient  reports that  has never smoked. she has never used smokeless tobacco. She reports that she does not drink alcohol or use drugs.   Family History:  The patient's family history includes Aneurysm in her other; Arthritis in her father and mother; Breast cancer  in her maternal aunt; Breast cancer (age of onset: 70) in her paternal aunt; Diabetes in her mother; Heart disease in her father; Hyperlipidemia in her sister; Hypertension in her mother; Kidney disease in her maternal aunt; Prostate cancer in her paternal grandfather; Thyroid cancer in her maternal aunt.  ROS:   ROS   PHYSICAL EXAM: *** VS:  There were no vitals taken for this visit. BMI: There is no height or weight on file to calculate BMI.  Physical Exam   EKG:   Was ordered and interpreted by me today. Shows ***  Recent Labs: 09/17/2016: ALT 14 09/25/2016: TSH 6.41 05/16/2017: Hemoglobin 12.4; Platelets 360 08/12/2017: BUN 18; Creatinine, Ser 0.81; Magnesium 2.4; Potassium 4.0; Sodium 139  09/17/2016: Cholesterol 243; HDL 46.60; LDL Cholesterol 169; Total CHOL/HDL Ratio 5; Triglycerides 138.0; VLDL 27.6   CrCl cannot be calculated (Patient's most recent lab result is older than the maximum 21 days allowed.).   Wt Readings from Last 3 Encounters:  08/12/17 262 lb (118.8 kg)  08/04/17 265 lb (120.2 kg)  07/22/17 264 lb 4 oz (119.9 kg)     Other studies reviewed: Additional studies/records reviewed today include: summarized above  ASSESSMENT AND PLAN:  1. ***  Disposition: F/u with *** in   Current medicines are reviewed at length with the patient today.  The patient did not have any concerns regarding medicines.  Signed, Eula Listen, PA-C 09/16/2017 12:55 PM     [facility] [address] [place], [place] [address] [phone]

## 2017-09-23 ENCOUNTER — Ambulatory Visit: Payer: PRIVATE HEALTH INSURANCE | Admitting: Physician Assistant

## 2018-06-20 ENCOUNTER — Emergency Department
Admission: EM | Admit: 2018-06-20 | Discharge: 2018-06-20 | Disposition: A | Discharge status: Home / Self Care | Payer: Self-pay | Attending: Emergency Medicine | Admitting: Emergency Medicine

## 2018-06-20 ENCOUNTER — Other Ambulatory Visit: Payer: Self-pay

## 2018-06-20 DIAGNOSIS — R1030 Lower abdominal pain, unspecified: Secondary | ICD-10-CM | POA: Insufficient documentation

## 2018-06-20 DIAGNOSIS — Z79899 Other long term (current) drug therapy: Secondary | ICD-10-CM | POA: Insufficient documentation

## 2018-06-20 DIAGNOSIS — I10 Essential (primary) hypertension: Secondary | ICD-10-CM | POA: Insufficient documentation

## 2018-06-20 DIAGNOSIS — N939 Abnormal uterine and vaginal bleeding, unspecified: Secondary | ICD-10-CM | POA: Insufficient documentation

## 2018-06-20 LAB — COMPREHENSIVE METABOLIC PANEL
ALBUMIN: 4.5 g/dL (ref 3.5–5.0)
ALK PHOS: 62 U/L (ref 38–126)
ALT: 14 U/L (ref 0–44)
AST: 18 U/L (ref 15–41)
Anion gap: 6 (ref 5–15)
BILIRUBIN TOTAL: 0.3 mg/dL (ref 0.3–1.2)
BUN: 22 mg/dL — ABNORMAL HIGH (ref 6–20)
CALCIUM: 9.1 mg/dL (ref 8.9–10.3)
CO2: 29 mmol/L (ref 22–32)
CREATININE: 0.81 mg/dL (ref 0.44–1.00)
Chloride: 105 mmol/L (ref 98–111)
GFR calc Af Amer: >60 mL/min (ref >60)
GFR calc non Af Amer: >60 mL/min (ref >60)
GLUCOSE: 91 mg/dL (ref 70–99)
Potassium: 3.7 mmol/L (ref 3.5–5.1)
Sodium: 140 mmol/L (ref 135–145)
TOTAL PROTEIN: 8 g/dL (ref 6.5–8.1)

## 2018-06-20 LAB — CBC WITH DIFFERENTIAL/PLATELET
Abs Immature Granulocytes: 0.02 10*3/uL (ref 0.00–0.07)
BASOS ABS: 0 10*3/uL (ref 0.0–0.1)
Basophils Relative: 1 %
EOS PCT: 3 %
Eosinophils Absolute: 0.2 10*3/uL (ref 0.0–0.5)
HEMATOCRIT: 36 % (ref 36.0–46.0)
HEMOGLOBIN: 11.7 g/dL — AB (ref 12.0–15.0)
IMMATURE GRANULOCYTES: 0 %
LYMPHS ABS: 2 10*3/uL (ref 0.7–4.0)
Lymphocytes Relative: 31 %
MCH: 28.8 pg (ref 26.0–34.0)
MCHC: 32.5 g/dL (ref 30.0–36.0)
MCV: 88.7 fL (ref 80.0–100.0)
MONOS PCT: 8 %
Monocytes Absolute: 0.5 10*3/uL (ref 0.1–1.0)
NRBC: 0 % (ref 0.0–0.2)
Neutro Abs: 3.8 10*3/uL (ref 1.7–7.7)
Neutrophils Relative %: 57 %
Platelets: 428 10*3/uL — ABNORMAL HIGH (ref 150–400)
RBC: 4.06 MIL/uL (ref 3.87–5.11)
RDW: 16.2 % — ABNORMAL HIGH (ref 11.5–15.5)
WBC: 6.5 10*3/uL (ref 4.0–10.5)

## 2018-06-20 LAB — HCG, QUANTITATIVE, PREGNANCY: hCG, Beta Chain, Quant, S: <1 m[IU]/mL (ref <5)

## 2018-06-20 MED ORDER — NAPROXEN 500 MG PO TABS
500.0000 mg | ORAL_TABLET | Freq: Once | ORAL | Status: AC
  Administered 2018-06-20: 500 mg via ORAL
  Filled 2018-06-20: qty 1

## 2018-06-20 MED ORDER — TRANEXAMIC ACID 650 MG PO TABS: 1300.0000 mg | ORAL_TABLET | Freq: Three times a day (TID) | ORAL | 0 refills | Status: AC

## 2018-06-20 MED ORDER — TRANEXAMIC ACID 650 MG PO TABS
1300.0000 mg | ORAL_TABLET | Freq: Once | ORAL | Status: AC
  Administered 2018-06-20: 1300 mg via ORAL
  Filled 2018-06-20: qty 2

## 2018-06-20 MED ORDER — NAPROXEN 500 MG PO TABS: 500.0000 mg | ORAL_TABLET | Freq: Two times a day (BID) | ORAL | 0 refills | Status: AC

## 2018-06-20 NOTE — ED Notes (Signed)
Pt discharged by lea, rn.  

## 2018-06-20 NOTE — Discharge Instructions (Signed)
It was a pleasure to take care of you today, and thank you for coming to our emergency department.  If you have any questions or concerns before leaving please ask the nurse to grab me and I'm more than happy to go through your aftercare instructions again.  If you were prescribed any opioid pain medication today such as Norco, Vicodin, Percocet, morphine, hydrocodone, or oxycodone please make sure you do not drive when you are taking this medication as it can alter your ability to drive safely.  If you have any concerns once you are home that you are not improving or are in fact getting worse before you can make it to your follow-up appointment, please do not hesitate to call 911 and come back for further evaluation.  Merrily BrittleNeil Jahn Franchini, MD  Results for orders placed or performed during the hospital encounter of 06/20/18  CBC with Differential  Result Value Ref Range   WBC 6.5 4.0 - 10.5 K/uL   RBC 4.06 3.87 - 5.11 MIL/uL   Hemoglobin 11.7 (L) 12.0 - 15.0 g/dL   HCT 16.136.0 09.636.0 - 04.546.0 %   MCV 88.7 80.0 - 100.0 fL   MCH 28.8 26.0 - 34.0 pg   MCHC 32.5 30.0 - 36.0 g/dL   RDW 40.916.2 (H) 81.111.5 - 91.415.5 %   Platelets 428 (H) 150 - 400 K/uL   nRBC 0.0 0.0 - 0.2 %   Neutrophils Relative % 57 %   Neutro Abs 3.8 1.7 - 7.7 K/uL   Lymphocytes Relative 31 %   Lymphs Abs 2.0 0.7 - 4.0 K/uL   Monocytes Relative 8 %   Monocytes Absolute 0.5 0.1 - 1.0 K/uL   Eosinophils Relative 3 %   Eosinophils Absolute 0.2 0.0 - 0.5 K/uL   Basophils Relative 1 %   Basophils Absolute 0.0 0.0 - 0.1 K/uL   Immature Granulocytes 0 %   Abs Immature Granulocytes 0.02 0.00 - 0.07 K/uL  Comprehensive metabolic panel  Result Value Ref Range   Sodium 140 135 - 145 mmol/L   Potassium 3.7 3.5 - 5.1 mmol/L   Chloride 105 98 - 111 mmol/L   CO2 29 22 - 32 mmol/L   Glucose, Bld 91 70 - 99 mg/dL   BUN 22 (H) 6 - 20 mg/dL   Creatinine, Ser 7.820.81 0.44 - 1.00 mg/dL   Calcium 9.1 8.9 - 95.610.3 mg/dL   Total Protein 8.0 6.5 - 8.1 g/dL   Albumin 4.5 3.5 - 5.0 g/dL   AST 18 15 - 41 U/L   ALT 14 0 - 44 U/L   Alkaline Phosphatase 62 38 - 126 U/L   Total Bilirubin 0.3 0.3 - 1.2 mg/dL   GFR calc non Af Amer >60 >60 mL/min   GFR calc Af Amer >60 >60 mL/min   Anion gap 6 5 - 15  hCG, quantitative, pregnancy  Result Value Ref Range   hCG, Beta Chain, Quant, S <1 <5 mIU/mL

## 2018-06-20 NOTE — ED Provider Notes (Signed)
[facility] Emergency Department Provider Note  ____________________________________________   First MD Initiated Contact with Patient 06/20/18 [phone]     (approximate)  I have reviewed the triage vital signs and the nursing notes.   HISTORY  Chief Complaint Vaginal Bleeding   HPI Tara Newman is a 47 y.o. female self presents to the emergency department with 3 or 4 days of heavy vaginal bleeding.  She is gone through 15 pads a day.  She denies lightheadedness or syncope.  She does have moderate severity cramping lower abdominal pain.  She is concerned that she could have uterine fibroids as she has a "strong family history" of the same.  She has not seen an OB gynecologist in many years as her primary care physician does her yearly Pap smears.  Her symptoms came on gradually are now constant and moderate to severe.  Nothing seems to make them better or worse.  No history of thyroid dysfunction.  No recent sexual intercourse.  No vaginal pain.   She normally has regular menstrual periods.   Past Medical History:  Diagnosis Date  . Family history of premature CAD    a. father diagnosed with CAD in his 59s  . Frequent headaches   . Hypertension   . Palpitations    a. Lexiscan 11/2016: no sig ischemia, nl wall motion, EF > 55%, low risk scan; b. TTE 11/2016 EF 60-65%, nl wall motion, GR1DD, nl RV sys fxn, PASP nl    Patient Active Problem List   Diagnosis Date Noted  . Hyperlipidemia 08/13/2017  . Elevated blood pressure reading 05/16/2017  . Palpitations 09/25/2016  . Dysphagia 09/17/2016  . Hypertension 08/25/2016    Past Surgical History:  Procedure Laterality Date  . broken ankle      Prior to Admission medications   Medication Sig Start Date End Date Taking? Authorizing Provider  acetaminophen (TYLENOL) 500 MG tablet Take 500 mg by mouth as needed.    [provider]  carvedilol (COREG) 3.125 MG tablet Take 1 tablet (3.125 mg total)  by mouth 2 (two) times daily. 08/12/17 11/10/17  End, Cristal Deer, MD  furosemide (LASIX) 40 MG tablet Take 40 mg by mouth daily as needed. 03/02/17 03/02/18  [provider]  losartan (COZAAR) 100 MG tablet Take 100 mg by mouth daily.    [provider]  naproxen (NAPROSYN) 500 MG tablet Take 1 tablet (500 mg total) by mouth 2 (two) times daily with a meal. 06/20/18 06/20/19  Merrily Brittle, MD  tranexamic acid (LYSTEDA) 650 MG TABS tablet Take 2 tablets (1,300 mg total) by mouth 3 (three) times daily for 5 days. 06/20/18 06/25/18  Merrily Brittle, MD    Allergies Lisinopril; Penicillins; and Amlodipine  Family History  Problem Relation Age of Onset  . Arthritis Mother   . Hypertension Mother   . Diabetes Mother   . Arthritis Father   . Heart disease Father   . Hyperlipidemia Sister   . Kidney disease Maternal Aunt   . Breast cancer Maternal Aunt   . Thyroid cancer Maternal Aunt   . Breast cancer Paternal Aunt 110  . Prostate cancer Paternal Grandfather   . Aneurysm Other   . Colon cancer Neg Hx   . Stomach cancer Neg Hx   . Rectal cancer Neg Hx   . Esophageal cancer Neg Hx   . Liver cancer Neg Hx     Social History Social History   Tobacco Use  . Smoking status: Never  Smoker  . Smokeless tobacco: Never Used  Substance Use Topics  . Alcohol use: No  . Drug use: No    Review of Systems Constitutional: No fever/chills Eyes: No visual changes. ENT: No sore throat. Cardiovascular: Denies chest pain. Respiratory: Denies shortness of breath. Gastrointestinal: Positive for abdominal pain.  No nausea, no vomiting.  No diarrhea.  No constipation. Genitourinary: Positive for vaginal bleeding Musculoskeletal: Negative for back pain. Skin: Negative for rash. Neurological: Negative for headaches, focal weakness or numbness.   ____________________________________________   PHYSICAL EXAM:  VITAL SIGNS: ED Triage Vitals  Enc Vitals Group     BP --       Pulse Rate 06/20/18 0152 99     Resp 06/20/18 0152 18     Temp 06/20/18 0152 98.5 F (36.9 C)     Temp Source 06/20/18 0152 Oral     SpO2 06/20/18 0152 100 %     Weight 06/20/18 0152 267 lb (121.1 kg)     Height 06/20/18 0152 5\' 3"  (1.6 m)     Head Circumference --      Peak Flow --      Pain Score 06/20/18 0159 0     Pain Loc --      Pain Edu? --      Excl. in GC? --     Constitutional: Alert and oriented x4 pleasant cooperative speaks in full clear sentences no diaphoresis Eyes: PERRL EOMI. Head: Atraumatic. Nose: No congestion/rhinnorhea. Mouth/Throat: No trismus Neck: No stridor.   Cardiovascular: Normal rate, regular rhythm. Grossly normal heart sounds.  Good peripheral circulation. Respiratory: Normal respiratory effort.  No retractions. Lungs CTAB and moving good air Gastrointestinal: Soft abdomen morbidly obese mild suprapubic tenderness with no rebound or guarding no peritonitis Musculoskeletal: No lower extremity edema   Neurologic:  Normal speech and language. No gross focal neurologic deficits are appreciated. Skin:  Skin is warm, dry and intact. No rash noted. Psychiatric: Mood and affect are normal. Speech and behavior are normal.    ____________________________________________   DIFFERENTIAL includes but not limited to  Dysfunctional uterine bleeding, anemia, vaginal laceration, hypothyroidism ____________________________________________   LABS (all labs ordered are listed, but only abnormal results are displayed)  Labs Reviewed  CBC WITH DIFFERENTIAL/PLATELET - Abnormal; Notable for the following components:      Result Value   Hemoglobin 11.7 (*)    RDW 16.2 (*)    Platelets 428 (*)    All other components within normal limits  COMPREHENSIVE METABOLIC PANEL - Abnormal; Notable for the following components:   BUN 22 (*)    All other components within normal limits  HCG, QUANTITATIVE, PREGNANCY    Lab work reviewed by me with no evidence of  significant anemia __________________________________________  EKG   ____________________________________________  RADIOLOGY   ____________________________________________   PROCEDURES  Procedure(s) performed: no  Procedures  Critical Care performed: no  ____________________________________________   INITIAL IMPRESSION / ASSESSMENT AND PLAN / ED COURSE  Pertinent labs & imaging results that were available during my care of the patient were reviewed by me and considered in my medical decision making (see chart for details).   As part of my medical decision making, I reviewed the following data within the electronic MEDICAL RECORD NUMBER History obtained from family if available, nursing notes, old chart and ekg, as well as notes from prior ED visits.  Patient comes to the emergency department with abnormal heavy vaginal bleeding.  She has normal vital signs and she is not  anemic.  Given a first dose of tranexamic acid here.  I discussed with the patient that she very well could have uterine fibroids however she requires [facility].  She does not have one so I will provide a referral.  We will also give a 5-day course of tranexamic acid for home.  Strict return precautions have been given.  She does feel improved after naproxen here.      ____________________________________________   FINAL CLINICAL IMPRESSION(S) / ED DIAGNOSES  Final diagnoses:  Abnormal uterine bleeding (AUB)      NEW MEDICATIONS STARTED DURING THIS VISIT:  Discharge Medication List as of 06/20/2018  5:53 AM    START taking these medications   Details  naproxen (NAPROSYN) 500 MG tablet Take 1 tablet (500 mg total) by mouth 2 (two) times daily with a meal., Starting Sun 06/20/2018, Until Mon 06/20/2019, Print    tranexamic acid (LYSTEDA) 650 MG TABS tablet Take 2 tablets (1,300 mg total) by mouth 3 (three) times daily for 5 days., Starting Sun 06/20/2018, Until Fri 06/25/2018, Print           Note:  This document was prepared using Dragon voice recognition software and may include unintentional dictation errors.     Merrily Brittleifenbark, Eddy Termine, MD 06/21/18 [phone]

## 2018-06-20 NOTE — ED Triage Notes (Signed)
Patient reports heavy vaginal bleeding with clots for several days .  

## 2018-07-29 ENCOUNTER — Encounter: Payer: Self-pay | Admitting: Obstetrics and Gynecology

## 2018-10-28 IMAGING — CR DG THORACIC SPINE 2V
1 series · 3 of 3 positions shown · non-contrast
Comparison: CXR 03/09/2011

CLINICAL DATA: Back pain

EXAM:
THORACIC SPINE 2 VIEWS

[Series 1: dg thoracic spine 2 view · 0.14mm/px · 3 of 3 slices shown]
[im 1/3]
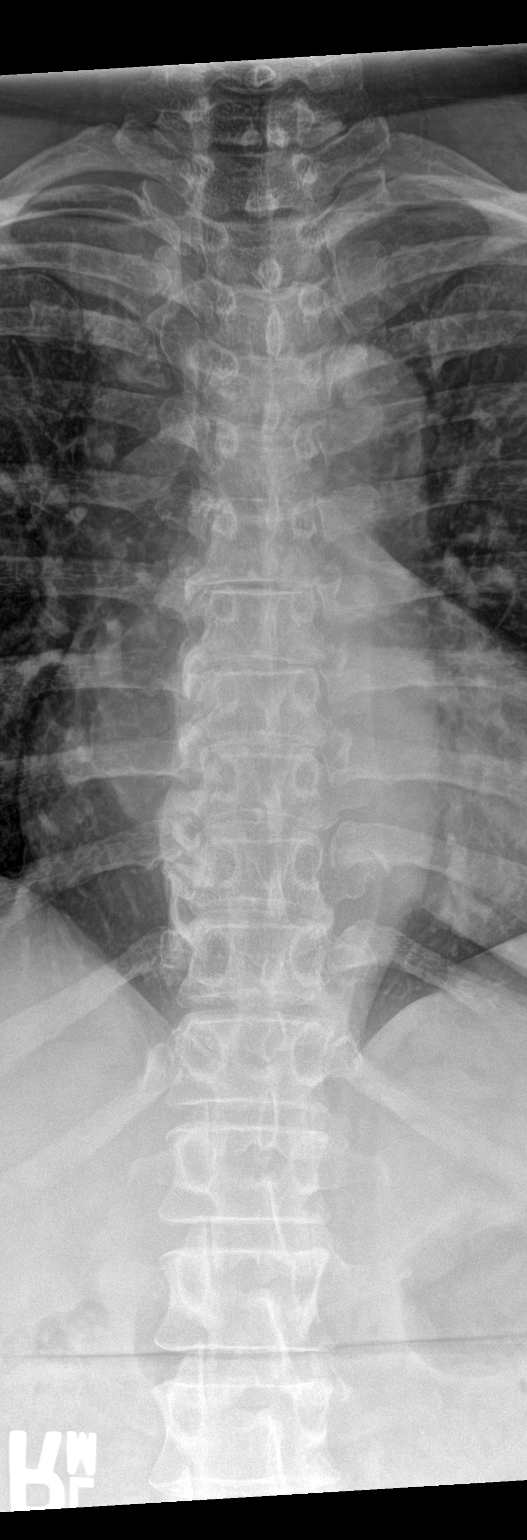
[im 2/3]
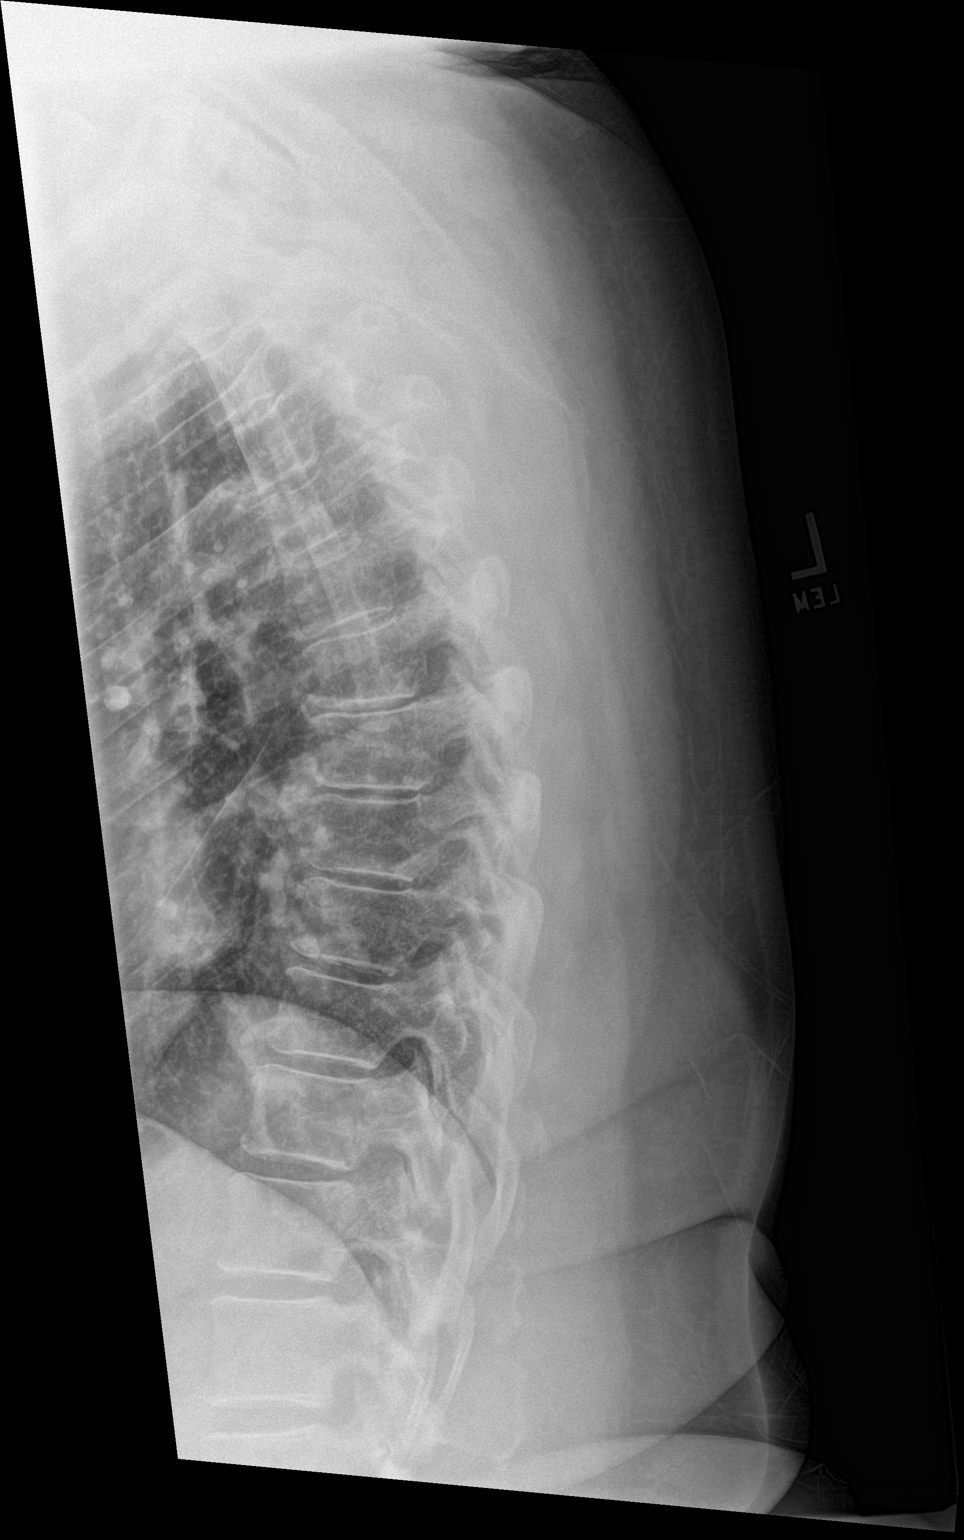
[im 3/3]
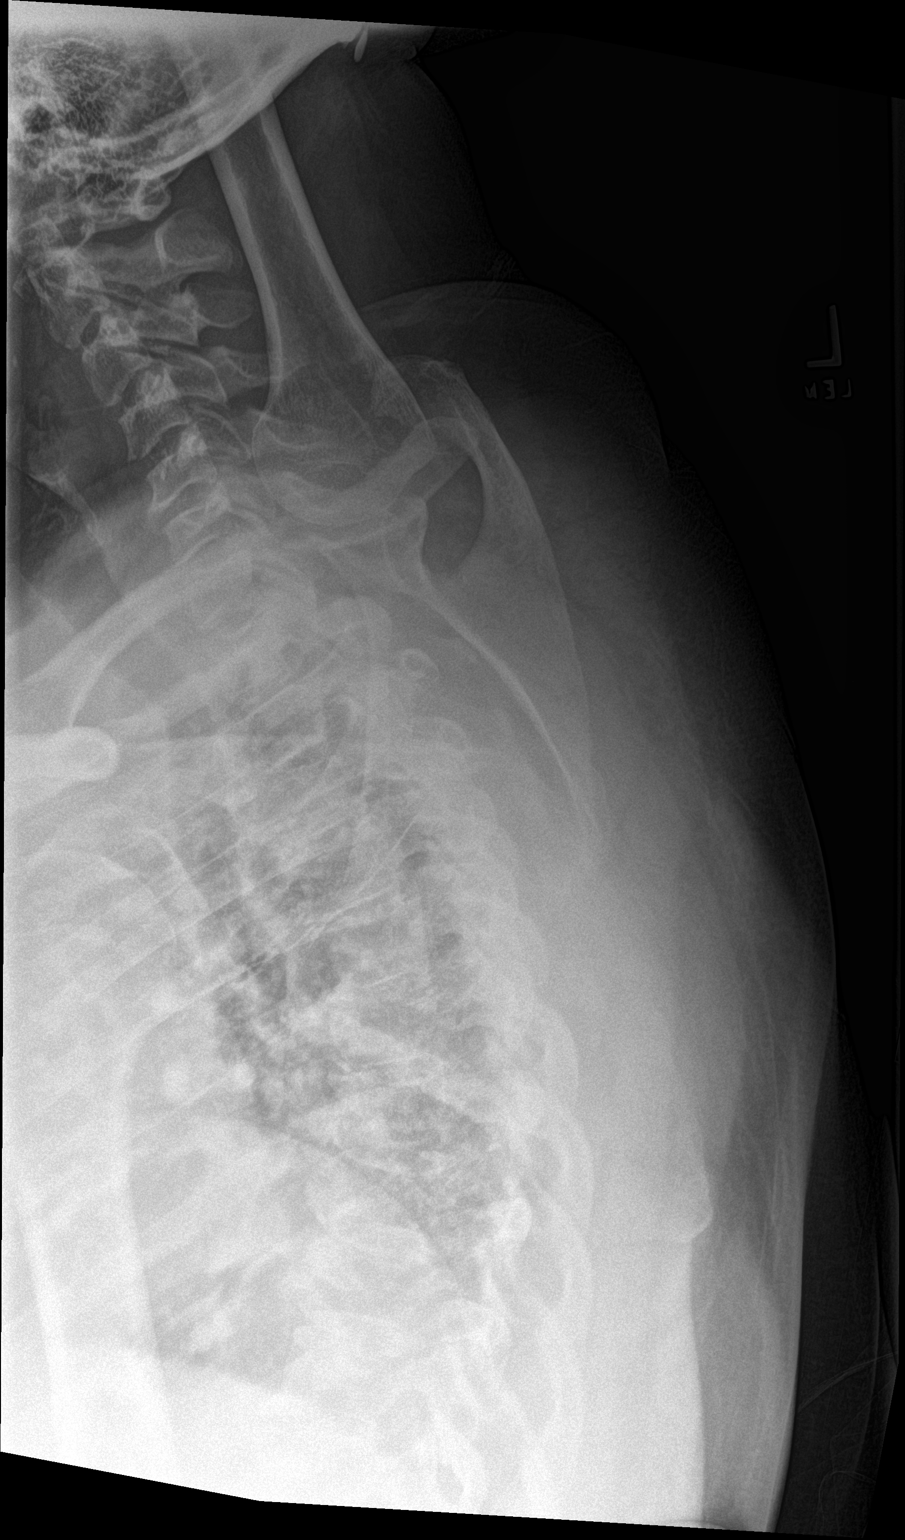

[3 of 3 positions shown; findings below may reference images not displayed]

FINDINGS: Mild mid thoracic kyphosis secondary to multilevel disc space
narrowing and degenerative endplate spurring. Minimal anterior
height loss of T6 is suggested since 6646 which could be simply
degenerative in etiology. No acute appearing fracture is identified.
No bone destruction. No paraspinal soft tissue swelling or hematoma.
Enthesophytes are seen along the right lateral aspect of the
thoracic spine.
IMPRESSION: Chronic degenerative change of the thoracic spine without acute
appearing fracture or bone destruction.

## 2018-10-28 IMAGING — CR DG LUMBAR SPINE COMPLETE 4+V
1 series · 5 of 5 positions shown · non-contrast
Comparison: None.

CLINICAL DATA: Back pain after fall

EXAM:
LUMBAR SPINE - COMPLETE 4+ VIEW

[Series 1: dg lumbar spine complete 4 +v · 0.14mm/px · 5 of 5 slices shown]
[im 1/5]
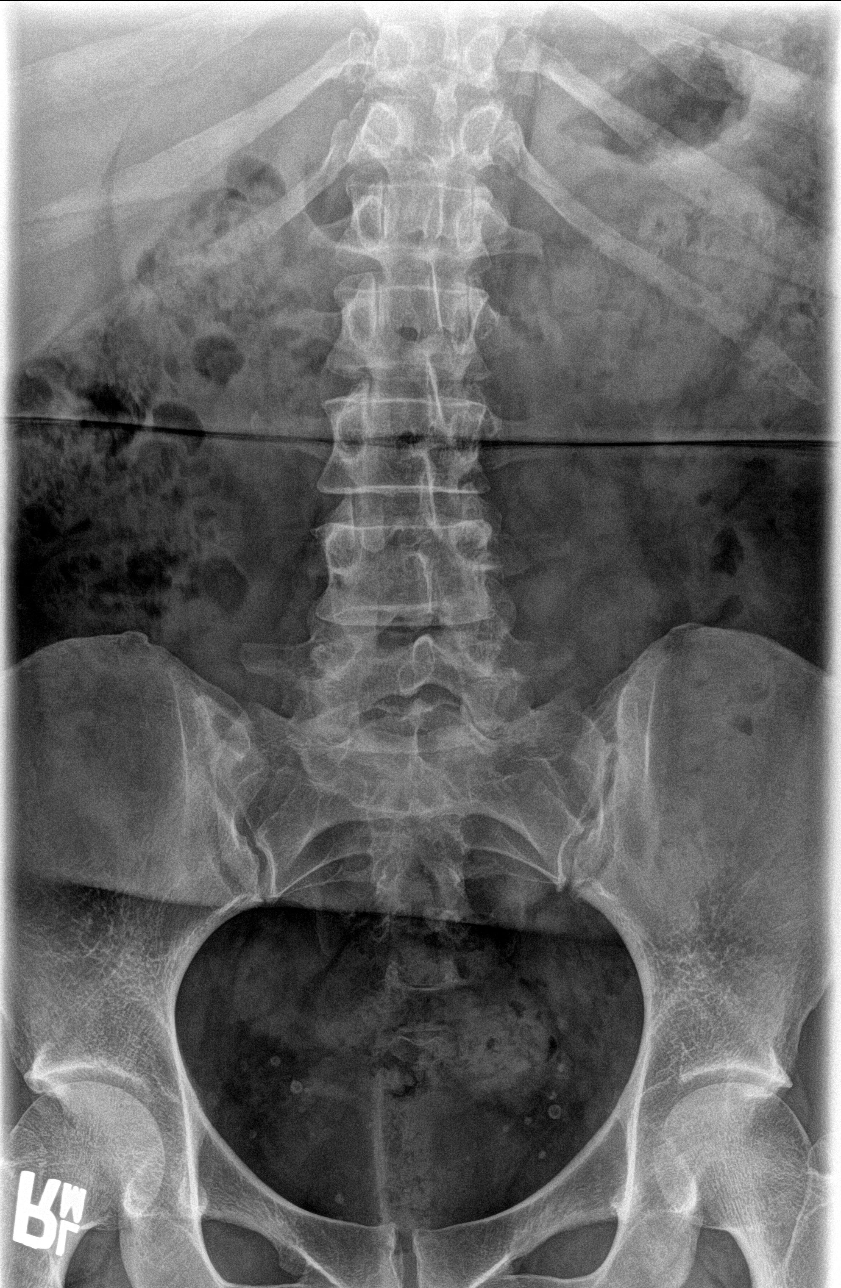
[im 2/5]
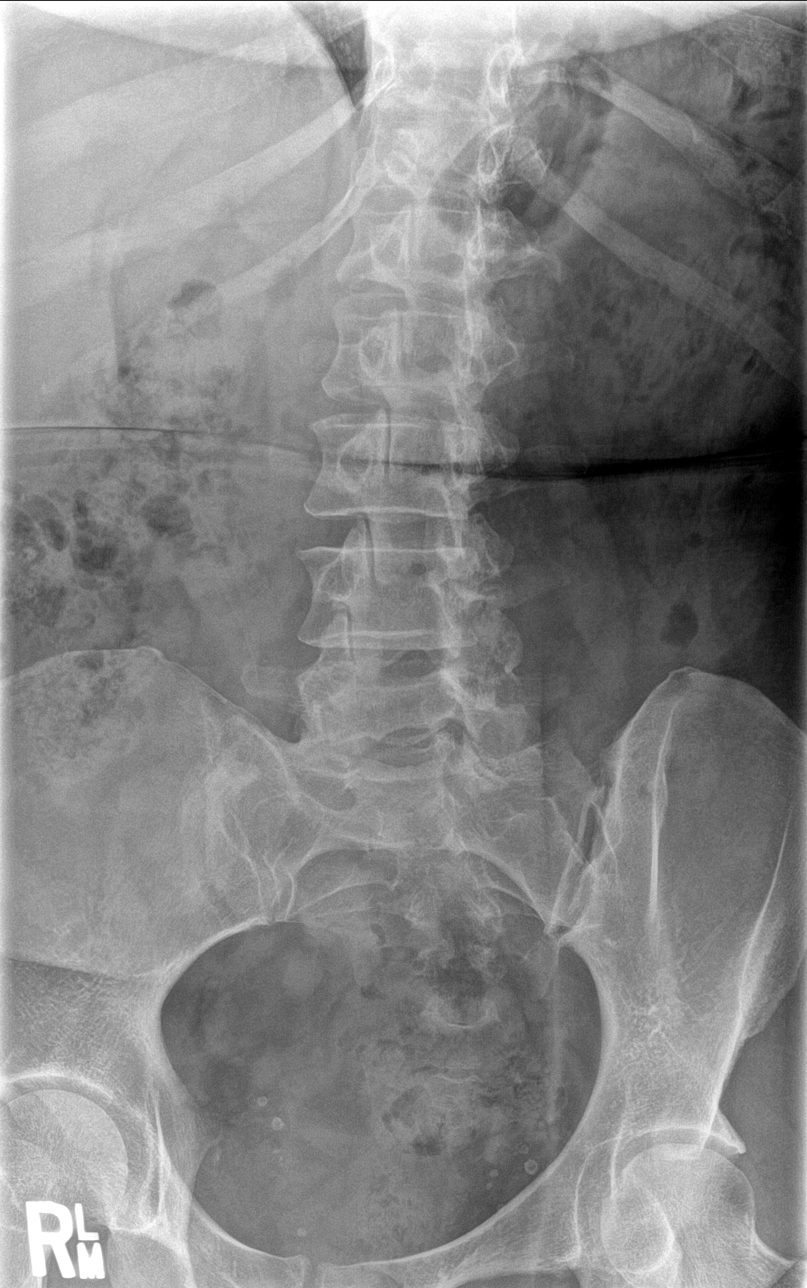
[im 3/5]
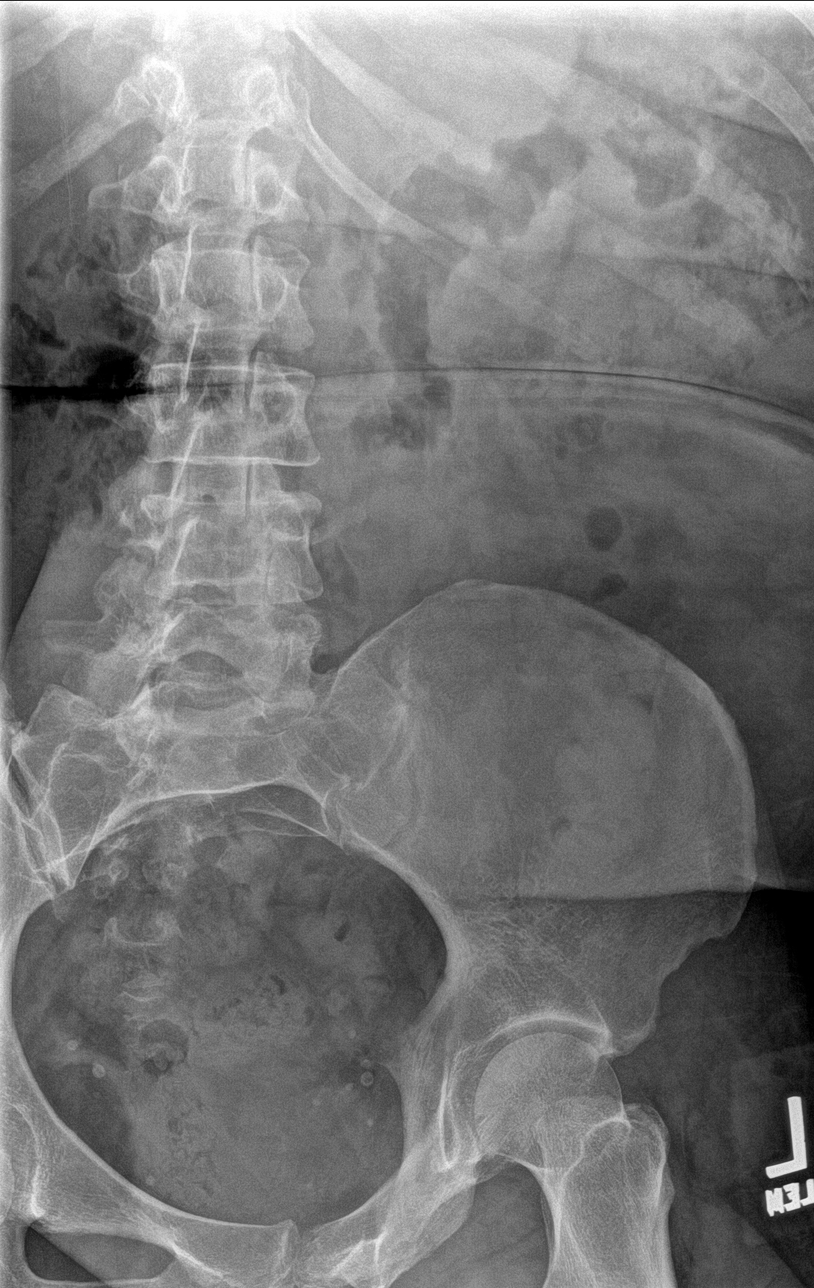
[im 4/5]
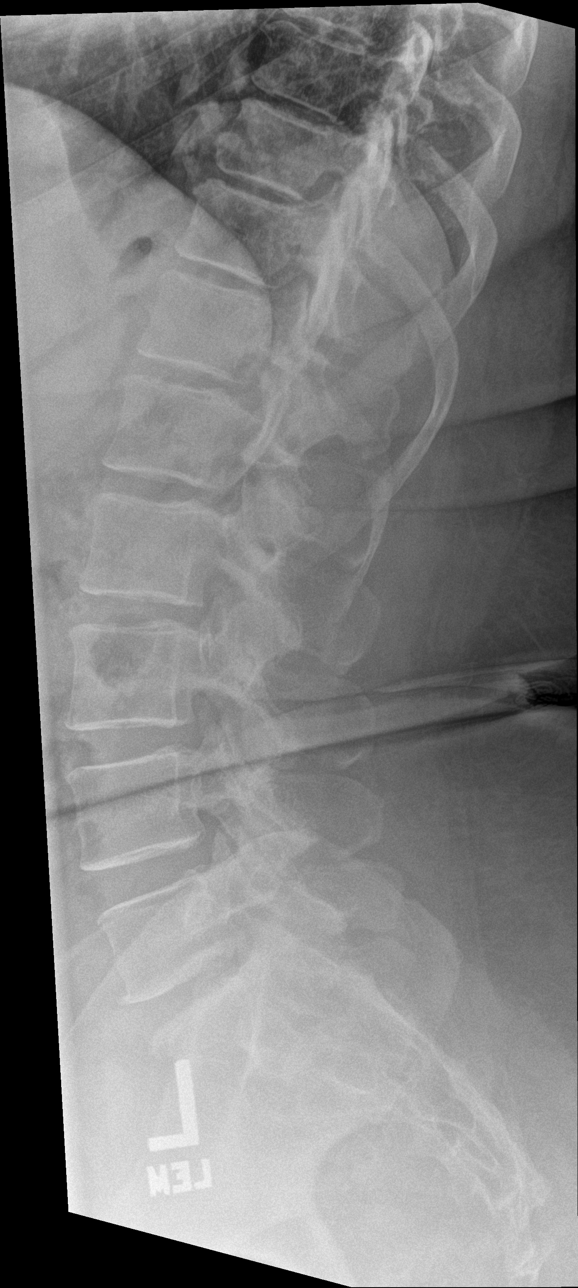
[im 5/5]
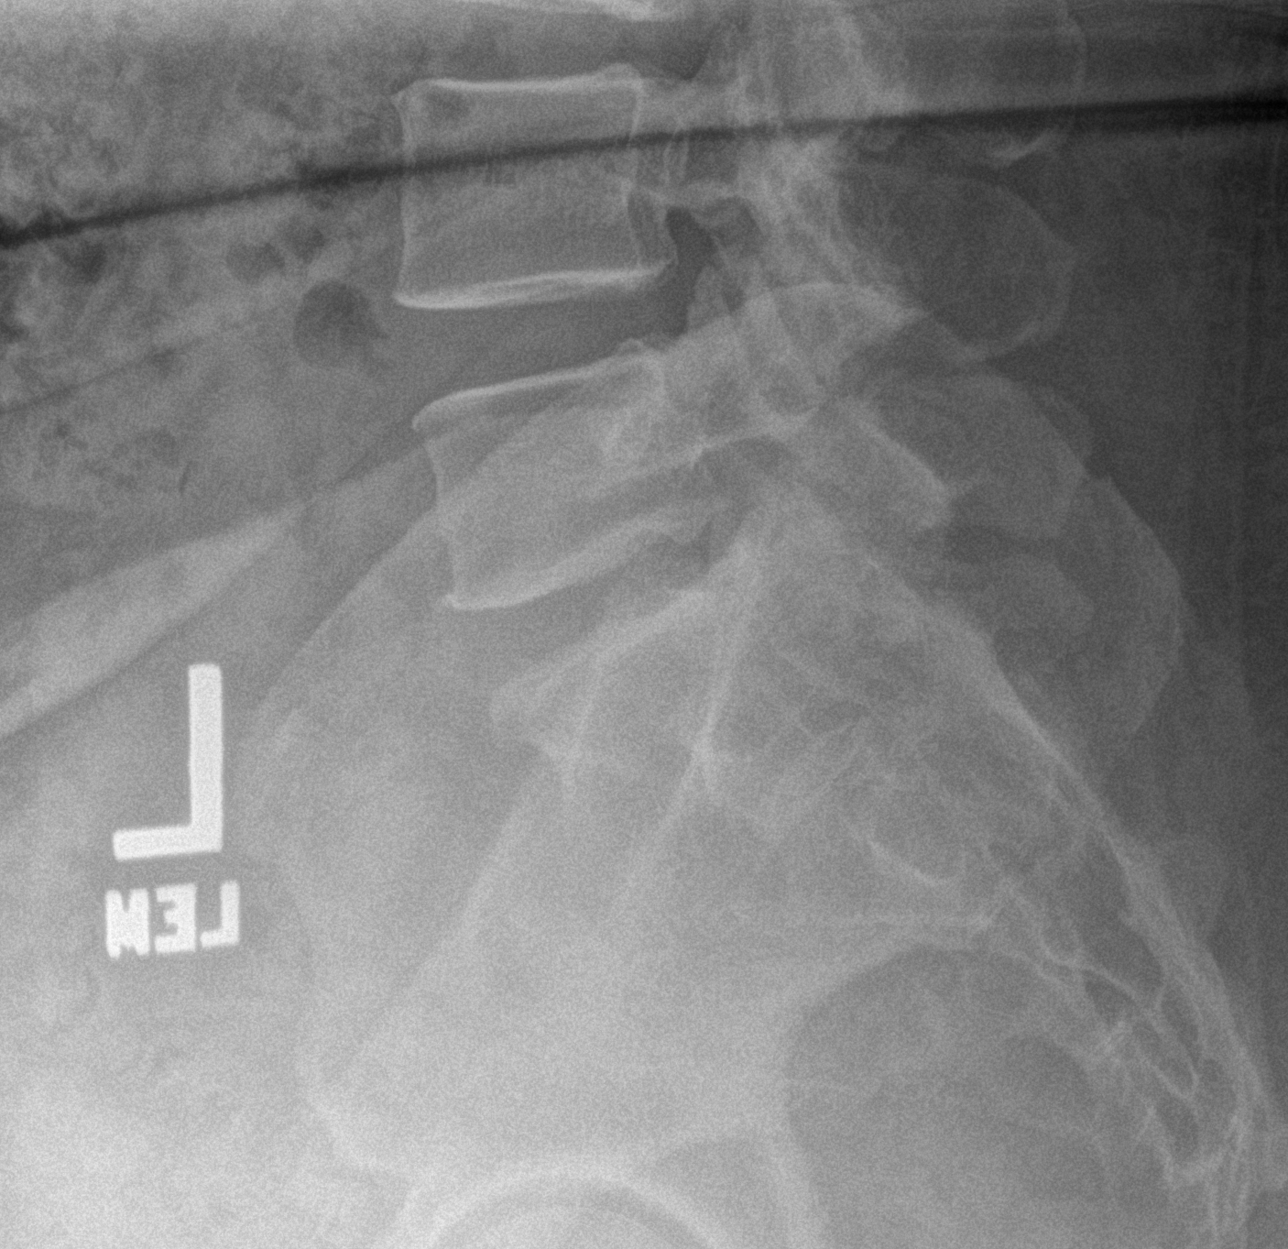

[5 of 5 positions shown; findings below may reference images not displayed]

FINDINGS: There is no evidence of lumbar spine fracture. Normal lumbar
segmentation. Mild facet arthropathy L4 through S1. Alignment is
normal. Intervertebral disc spaces are maintained.
IMPRESSION: No acute osseous abnormality of the lumbar spine. Mild joint space
narrowing and sclerosis of the L4 through S1 facets.

## 2018-12-22 IMAGING — US US RENAL ARTERY STENOSIS
1 series · 13 of 25 positions shown · non-contrast
Comparison: None.

CLINICAL DATA: 46-year-old female with a history of essential
hypertension and family history of coronary disease. Concern for
renal artery stenosis

EXAM:
RENAL/URINARY TRACT ULTRASOUND
RENAL DUPLEX DOPPLER ULTRASOUND

[Series 1: us renal artery stenosis · 0.25mm/px · 13 of 118 slices shown]
[im 1/118]
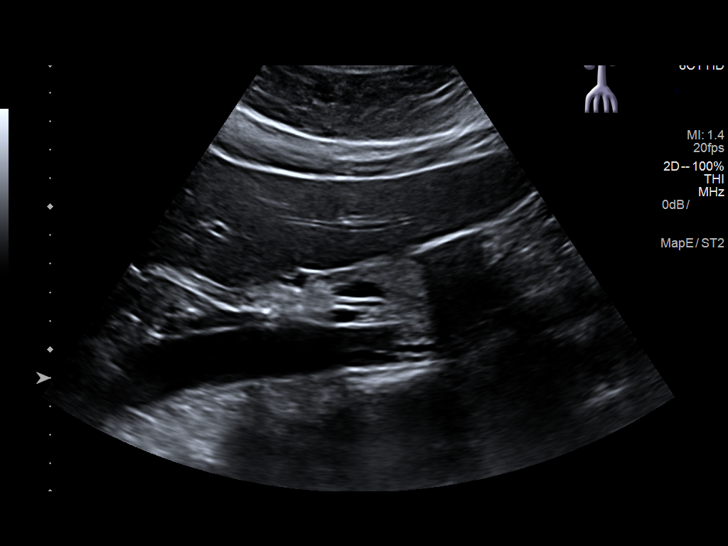
[im 10/118]
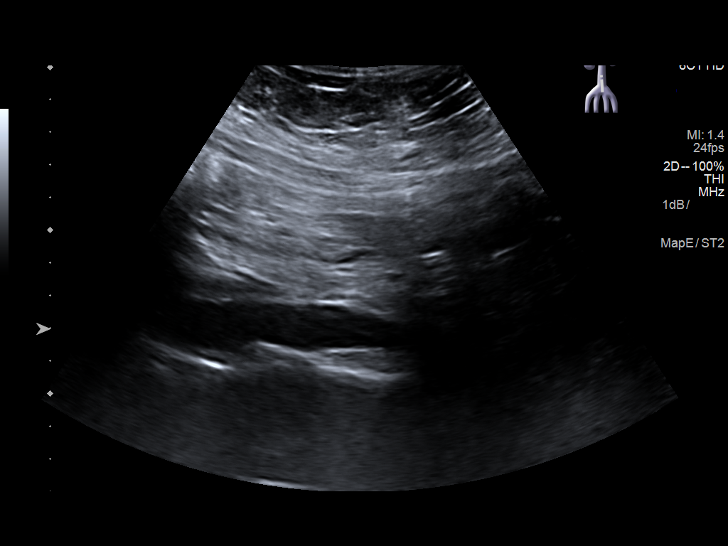
[im 20/118]
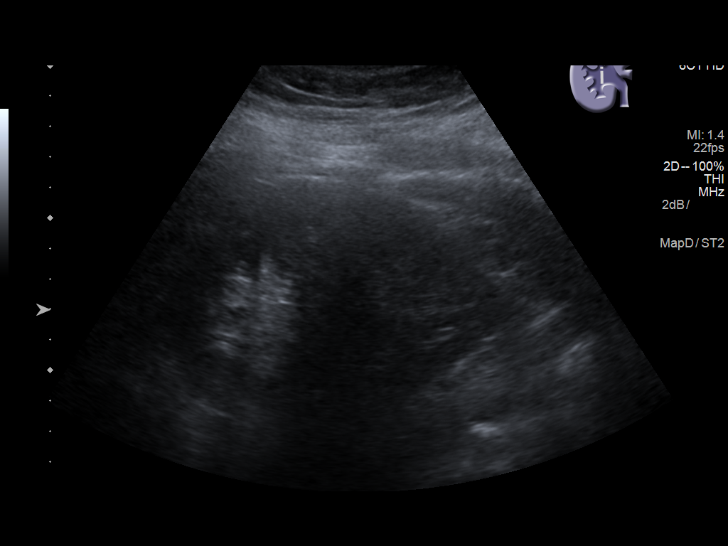
[im 30/118]
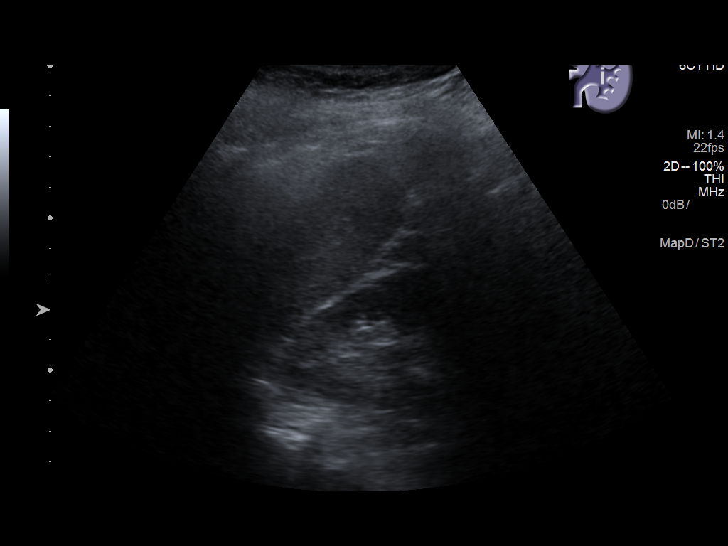
[im 40/118]
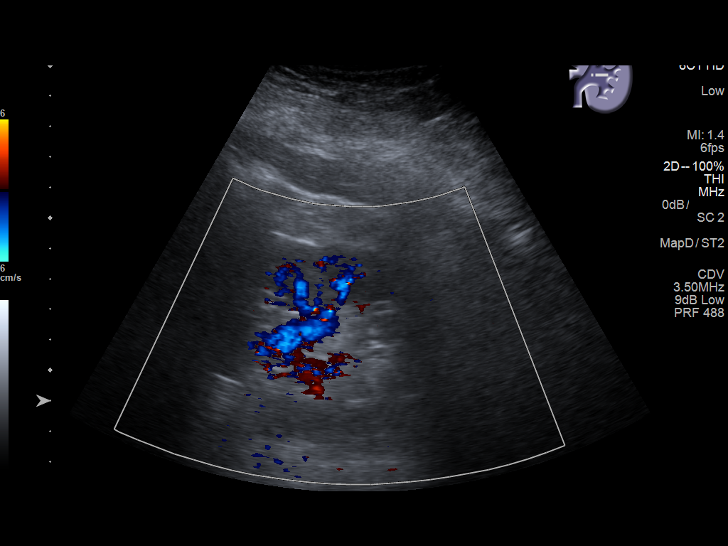
[im 49/118]
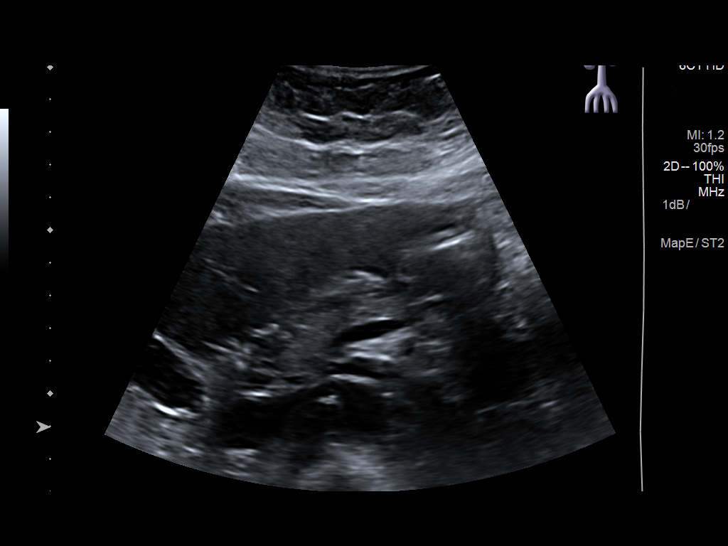
[im 59/118]
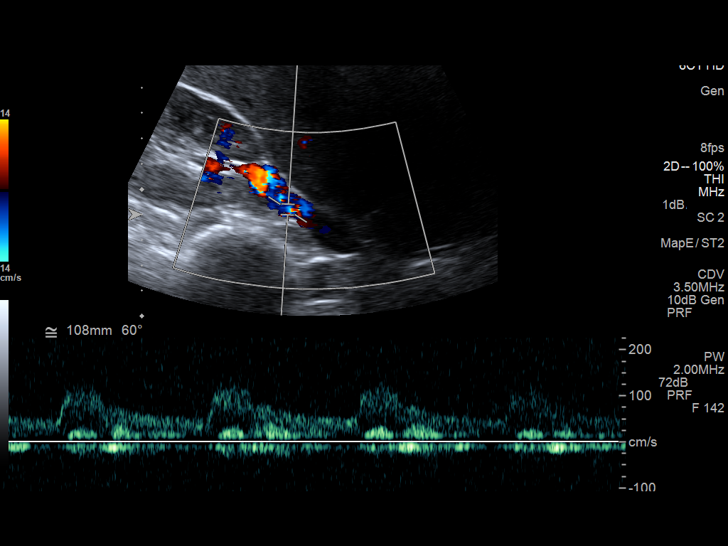
[im 69/118]
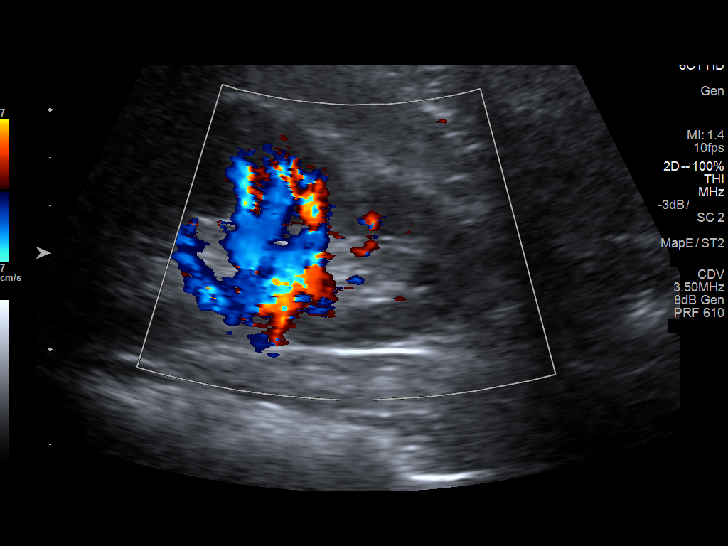
[im 79/118]
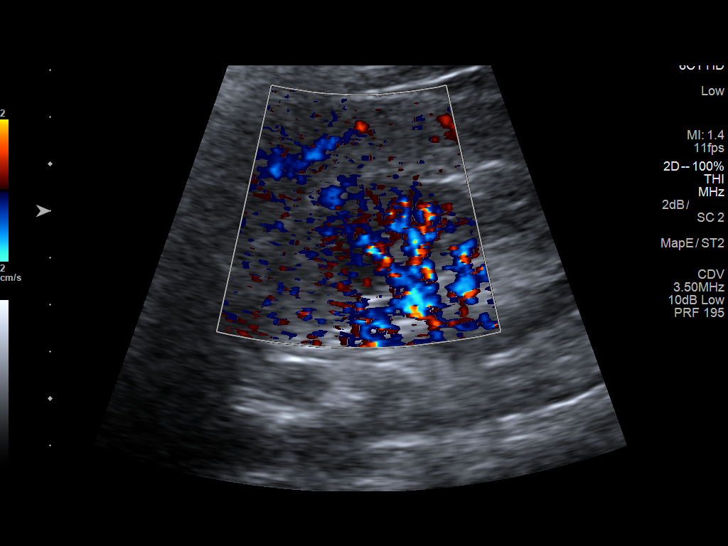
[im 88/118]
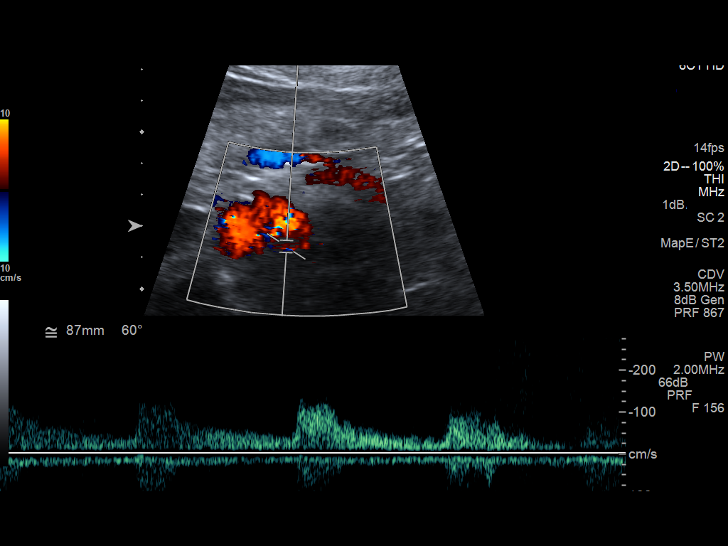
[im 98/118]
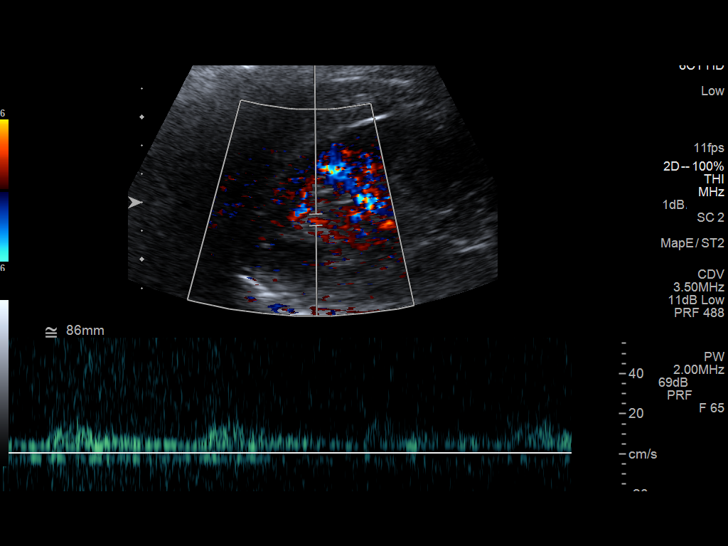
[im 108/118]
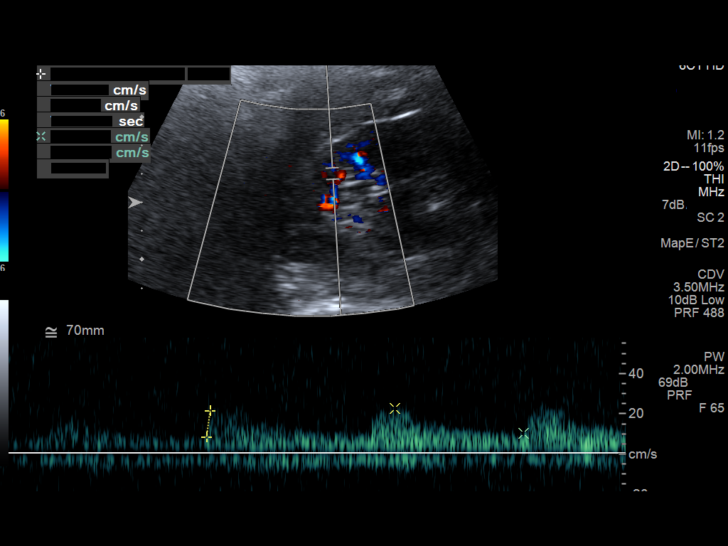
[im 118/118]
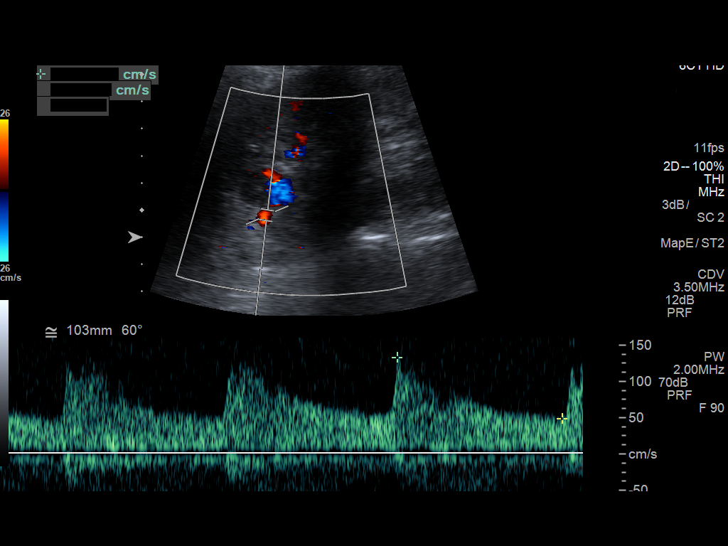

[13 of 25 positions shown; findings below may reference images not displayed]

FINDINGS: Right Kidney:

Length: 11.0 cm. Echogenicity within normal limits. No mass or
hydronephrosis visualized.

Left Kidney:

Length: 11.2 cm. Echogenicity within normal limits. No mass or
hydronephrosis visualized.

Bladder: Unremarkable appearance of the urinary bladder with only
the left ureteral jet visualized.

RENAL DUPLEX ULTRASOUND

Right Renal Artery Velocities:

Origin:  146 cm/sec

Mid:  133 cm/sec

Hilum:  133 cm/sec

Interlobar:  25 cm/sec

Arcuate:  25 Cm/sec

Left Renal Artery Velocities:

Origin:  134 cm/sec

Mid:  148 cm/sec

Hilum:  133 cm/sec

Interlobar:  38 cm/sec

Arcuate:  23 cm/sec

Aortic Velocity:  165 Cm/sec

Right Renal-Aortic Ratios:

Origin:

Mid:

Hilum:

Interlobar:

Arcuate:

Left Renal-Aortic Ratios:

Origin:

Mid:

Hilum:

Interlobar:

Arcuate:
IMPRESSION: Duplex ultrasound of the bilateral renal arteries demonstrates no
evidence of significant stenosis.

Sonographic survey unremarkable of the bilateral kidneys.

## 2019-02-21 ENCOUNTER — Encounter: Payer: Self-pay | Admitting: *Deleted

## 2019-02-21 ENCOUNTER — Other Ambulatory Visit: Payer: Self-pay

## 2019-02-21 ENCOUNTER — Emergency Department
Admission: EM | Admit: 2019-02-21 | Discharge: 2019-02-21 | Disposition: A | Discharge status: Home / Self Care | Payer: HRSA Program | Attending: Emergency Medicine | Admitting: Emergency Medicine

## 2019-02-21 ENCOUNTER — Emergency Department: Payer: HRSA Program

## 2019-02-21 DIAGNOSIS — R197 Diarrhea, unspecified: Secondary | ICD-10-CM | POA: Insufficient documentation

## 2019-02-21 DIAGNOSIS — I10 Essential (primary) hypertension: Secondary | ICD-10-CM | POA: Insufficient documentation

## 2019-02-21 DIAGNOSIS — Z79899 Other long term (current) drug therapy: Secondary | ICD-10-CM | POA: Diagnosis not present

## 2019-02-21 DIAGNOSIS — U071 COVID-19: Secondary | ICD-10-CM | POA: Diagnosis not present

## 2019-02-21 HISTORY — DX: COVID-19: U07.1

## 2019-02-21 LAB — CBC
HCT: 35.9 % — ABNORMAL LOW (ref 36.0–46.0)
Hemoglobin: 12.2 g/dL (ref 12.0–15.0)
MCH: 28.4 pg (ref 26.0–34.0)
MCHC: 34 g/dL (ref 30.0–36.0)
MCV: 83.7 fL (ref 80.0–100.0)
Platelets: 386 10*3/uL (ref 150–400)
RBC: 4.29 MIL/uL (ref 3.87–5.11)
RDW: 14.9 % (ref 11.5–15.5)
WBC: 6.3 10*3/uL (ref 4.0–10.5)
nRBC: 0 % (ref 0.0–0.2)

## 2019-02-21 LAB — BASIC METABOLIC PANEL
Anion gap: 9 (ref 5–15)
BUN: 13 mg/dL (ref 6–20)
CO2: 26 mmol/L (ref 22–32)
Calcium: 9.1 mg/dL (ref 8.9–10.3)
Chloride: 100 mmol/L (ref 98–111)
Creatinine, Ser: 0.73 mg/dL (ref 0.44–1.00)
GFR calc Af Amer: >60 mL/min (ref >60)
GFR calc non Af Amer: >60 mL/min (ref >60)
Glucose, Bld: 93 mg/dL (ref 70–99)
Potassium: 3.6 mmol/L (ref 3.5–5.1)
Sodium: 135 mmol/L (ref 135–145)

## 2019-02-21 LAB — TROPONIN I (HIGH SENSITIVITY)
Troponin I (High Sensitivity): 4 ng/L (ref <18)
Troponin I (High Sensitivity): 5 ng/L (ref <18)

## 2019-02-21 MED ORDER — DICYCLOMINE HCL 20 MG PO TABS
20.0000 mg | ORAL_TABLET | Freq: Three times a day (TID) | ORAL | 0 refills | Status: AC | PRN
Start: 2019-02-21 — End: ?

## 2019-02-21 MED ORDER — SODIUM CHLORIDE 0.9% FLUSH
3.0000 mL | Freq: Once | INTRAVENOUS | Status: AC
  Administered 2019-02-21: 3 mL via INTRAVENOUS

## 2019-02-21 NOTE — Discharge Instructions (Addendum)
Please seek medical attention for any high fevers, chest pain, shortness of breath, change in behavior, persistent vomiting, bloody stool or any other new or concerning symptoms.  

## 2019-02-21 NOTE — ED Triage Notes (Signed)
Pt c/o diarrhea x 2 weeks. Pt c/o loose stool this morning, but nothing further since. Pt states episodes of dizziness and lightheadedness. Pt c/o heart palpitations intermittently, last episode x 3 hrs ago. Pt denies CP and shortness of breath. Pt released from [place] isolation 02/18/19.

## 2019-02-21 NOTE — ED Provider Notes (Signed)
[facility] Emergency Department Provider Note   ____________________________________________   I have reviewed the triage vital signs and the nursing notes.   HISTORY  Chief Complaint Dehydration   History limited by: Not Limited   HPI ALIEYAH Newman is a 48 y.o. female who presents to the emergency department today because of concern for possible electrolyte abnormality and dehydration. The patient states that she recently got over from [place] and was released from quarantine. The patient states that during her illness she had significant diarrhea. Today however only had one episode. The patient says that she has been trying to drink Pedialyte to help maintain some hydration. Has had issues with potassium decreasing in the past.     Records reviewed. Per medical record review patient has a history of COVID  Past Medical History:  Diagnosis Date  . COVID-19 virus infection   . Family history of premature CAD    a. father diagnosed with CAD in his 7s  . Frequent headaches   . Hypertension   . Palpitations    a. Lexiscan 11/2016: no sig ischemia, nl wall motion, EF > 55%, low risk scan; b. TTE 11/2016 EF 60-65%, nl wall motion, GR1DD, nl RV sys fxn, PASP nl    Patient Active Problem List   Diagnosis Date Noted  . Hyperlipidemia 08/13/2017  . Elevated blood pressure reading 05/16/2017  . Palpitations 09/25/2016  . Dysphagia 09/17/2016  . Hypertension 08/25/2016    Past Surgical History:  Procedure Laterality Date  . broken ankle      Prior to Admission medications   Medication Sig Start Date End Date Taking? Authorizing Provider  acetaminophen (TYLENOL) 500 MG tablet Take 500 mg by mouth as needed.    [provider]  carvedilol (COREG) 3.125 MG tablet Take 1 tablet (3.125 mg total) by mouth 2 (two) times daily. 08/12/17 11/10/17  End, Harrell Gave, MD  furosemide (LASIX) 40 MG tablet Take 40 mg by mouth daily as needed. 03/02/17 03/02/18   [provider]  losartan (COZAAR) 100 MG tablet Take 100 mg by mouth daily.    [provider]  naproxen (NAPROSYN) 500 MG tablet Take 1 tablet (500 mg total) by mouth 2 (two) times daily with a meal. 06/20/18 06/20/19  Darel Hong, MD    Allergies Lisinopril, Penicillins, and Amlodipine  Family History  Problem Relation Age of Onset  . Arthritis Mother   . Hypertension Mother   . Diabetes Mother   . Arthritis Father   . Heart disease Father   . Hyperlipidemia Sister   . Kidney disease Maternal Aunt   . Breast cancer Maternal Aunt   . Thyroid cancer Maternal Aunt   . Breast cancer Paternal Aunt 59  . Prostate cancer Paternal Grandfather   . Aneurysm Other   . Colon cancer Neg Hx   . Stomach cancer Neg Hx   . Rectal cancer Neg Hx   . Esophageal cancer Neg Hx   . Liver cancer Neg Hx     Social History Social History   Tobacco Use  . Smoking status: Never Smoker  . Smokeless tobacco: Never Used  Substance Use Topics  . Alcohol use: No  . Drug use: No    Review of Systems Constitutional: No fever/chills Eyes: No visual changes. ENT: No sore throat. Cardiovascular: Denies chest pain. Positive for palpitations Respiratory: Denies shortness of breath. Gastrointestinal: Positive for diarrhea, some abdominal pain.  Genitourinary: Negative for dysuria. Musculoskeletal: Negative for back pain. Skin: Negative  for rash. Neurological: Negative for headaches, focal weakness or numbness.  ____________________________________________   PHYSICAL EXAM:  VITAL SIGNS: ED Triage Vitals  Enc Vitals Group     BP 02/21/19 1645 (!) 167/86     Pulse Rate 02/21/19 1645 98     Resp 02/21/19 1645 20     Temp 02/21/19 1645 99.6 F (37.6 C)     Temp Source 02/21/19 1645 Oral     SpO2 02/21/19 1645 98 %     Weight 02/21/19 1646 260 lb (117.9 kg)     Height 02/21/19 1646 5\' 3"  (1.6 m)     Head Circumference --      Peak Flow --      Pain Score 02/21/19 1646  0  Constitutional: Alert and oriented.  Eyes: Conjunctivae are normal.  ENT      Head: Normocephalic and atraumatic.      Nose: No congestion/rhinnorhea.      Mouth/Throat: Mucous membranes are moist.      Neck: No stridor. Hematological/Lymphatic/Immunilogical: No cervical lymphadenopathy. Cardiovascular: Normal rate, regular rhythm.  No murmurs, rubs, or gallops.  Respiratory: Normal respiratory effort without tachypnea nor retractions. Breath sounds are clear and equal bilaterally. No wheezes/rales/rhonchi. Gastrointestinal: Soft and minimally tender to palpation on the left side Genitourinary: Deferred Musculoskeletal: Normal range of motion in all extremities. No lower extremity edema. Neurologic:  Normal speech and language. No gross focal neurologic deficits are appreciated.  Skin:  Skin is warm, dry and intact. No rash noted. Psychiatric: Mood and affect are normal. Speech and behavior are normal. Patient exhibits appropriate insight and judgment.  ____________________________________________    LABS (pertinent positives/negatives)  Trop hs 4 BMP wnl CBC wbc 6.3, hgb 12.2, plt 386  ____________________________________________   EKG  I, Phineas SemenGraydon Vail Vuncannon, attending physician, personally viewed and interpreted this EKG  EKG Time: 1655 Rate: 83 Rhythm: normal sinus rhythm Axis: normal Intervals: qtc 432 QRS: narrow, q waves v1, v2 ST changes: no st elevation Impression: abnormal ekg  ____________________________________________    RADIOLOGY  CXR No acute disease  ____________________________________________   PROCEDURES  Procedures  ____________________________________________   INITIAL IMPRESSION / ASSESSMENT AND PLAN / ED COURSE  Pertinent labs & imaging results that were available during my care of the patient were reviewed by me and considered in my medical decision making (see chart for details).   Patient presented to the emergency department  today because of concerns for possible electrolyte abnormality and dehydration in the setting of diarrhea.  She states she has had diarrhea for the past 2 weeks.  Did recently leave quarantine from COVID infection.  Blood work today without concerning electrolyte abnormality.  No elevation of creatinine.  At this point doubt significant dehydration.  I discussed this with the patient.  Patient continues to have some abdominal cramping so will give prescription for Bentyl.  Discussed importance of hydration.   ____________________________________________   FINAL CLINICAL IMPRESSION(S) / ED DIAGNOSES  Final diagnoses:  Diarrhea, unspecified type     Note: This dictation was prepared with Dragon dictation. Any transcriptional errors that result from this process are unintentional     Phineas SemenGoodman, Graig Hessling, MD 02/21/19 2109

## 2019-06-10 ENCOUNTER — Other Ambulatory Visit: Payer: Self-pay

## 2019-06-10 DIAGNOSIS — Z20822 Contact with and (suspected) exposure to covid-19: Secondary | ICD-10-CM

## 2019-06-13 LAB — NOVEL CORONAVIRUS, NAA: SARS-CoV-2, NAA: NOT DETECTED

## 2019-09-09 ENCOUNTER — Other Ambulatory Visit: Payer: Self-pay

## 2021-01-06 ENCOUNTER — Other Ambulatory Visit: Payer: Self-pay

## 2021-01-06 ENCOUNTER — Ambulatory Visit
Admission: EM | Admit: 2021-01-06 | Discharge: 2021-01-06 | Disposition: A | Discharge status: Home / Self Care | Payer: BLUE CROSS/BLUE SHIELD | Attending: Physician Assistant | Admitting: Physician Assistant

## 2021-01-06 ENCOUNTER — Encounter: Payer: Self-pay | Admitting: Emergency Medicine

## 2021-01-06 DIAGNOSIS — J039 Acute tonsillitis, unspecified: Secondary | ICD-10-CM | POA: Insufficient documentation

## 2021-01-06 DIAGNOSIS — J029 Acute pharyngitis, unspecified: Secondary | ICD-10-CM | POA: Insufficient documentation

## 2021-01-06 LAB — POCT RAPID STREP A: Streptococcus, Group A Screen (Direct): NEGATIVE

## 2021-01-06 MED ORDER — PREDNISONE 20 MG PO TABS: 20.0000 mg | ORAL_TABLET | Freq: Every day | ORAL | 0 refills | Status: AC

## 2021-01-06 MED ORDER — LIDOCAINE VISCOUS HCL 2 % MT SOLN: 15.0000 mL | OROMUCOSAL | 0 refills | Status: AC | PRN

## 2021-01-06 NOTE — ED Triage Notes (Signed)
Patient c/o sore throat that started on Friday.  Patient denies fevers.

## 2021-01-06 NOTE — Discharge Instructions (Addendum)
SORE THROAT: Negative rapid strep test. We have sent a specimen for culture and will call if it is positive in a couple of days. The treatment of sore throat depends upon the cause; strep throat is treated with an antibiotic, while viral pharyngitis is treated with rest, pain relievers. If prescribed, finish the entire course of antibiotics .Increase rest, fluids, and OTC meds as needed . I have sent low dose prednisone for swelling and inflammation and viscous lidocaine for pain. If you have any questions or concerns, please call us or stop back at any time and we will be happy to help you. If your symptoms worsen, f/u with our office  or go to the ER

## 2021-01-06 NOTE — ED Provider Notes (Signed)
[facility]    CSN: [id number] Arrival date & time: 01/06/21  1140      History   Chief Complaint Chief Complaint  Patient presents with   Sore Throat    HPI Tara Newman is a 50 y.o. female presenting for approximately 4-day history of sore throat and postnasal drainage.  She states that she occasionally coughs up some yellowish material in the morning.  No fever or fatigue or body aches.  Denies shortness of breath, nausea/vomiting or diarrhea.  Personal history of COVID-19 1 month ago.  States she took an at home rapid COVID test and it was negative.  Has not taken any OTC meds for her symptoms.  Symptoms not getting better or worse since the onset.  No other complaints or concerns.  HPI  Past Medical History:  Diagnosis Date   COVID-19 virus infection    Family history of premature CAD    a. father diagnosed with CAD in his 92s   Frequent headaches    Hypertension    Palpitations    a. Lexiscan 11/2016: no sig ischemia, nl wall motion, EF > 55%, low risk scan; b. TTE 11/2016 EF 60-65%, nl wall motion, GR1DD, nl RV sys fxn, PASP nl    Patient Active Problem List   Diagnosis Date Noted   Hyperlipidemia 08/13/2017   Elevated blood pressure reading 05/16/2017   Palpitations 09/25/2016   Dysphagia 09/17/2016   Hypertension 08/25/2016    Past Surgical History:  Procedure Laterality Date   broken ankle      OB History   No obstetric history on file.      Home Medications    Prior to Admission medications   Medication Sig Start Date End Date Taking? Authorizing Provider  furosemide (LASIX) 40 MG tablet Take 40 mg by mouth daily as needed. 03/02/17 01/06/21 Yes [provider]  lidocaine (XYLOCAINE) 2 % solution Use as directed 15 mLs in the mouth or throat every 3 (three) hours as needed for up to 7 days for mouth pain (swish and spit). 01/06/21 01/13/21 Yes Shirlee Latch, PA-C  losartan (COZAAR) 100 MG tablet Take 100 mg by mouth daily.    Yes [provider]  predniSONE (DELTASONE) 20 MG tablet Take 1 tablet (20 mg total) by mouth daily for 5 days. 01/06/21 01/11/21 Yes Shirlee Latch, PA-C  acetaminophen (TYLENOL) 500 MG tablet Take 500 mg by mouth as needed.    [provider]  carvedilol (COREG) 3.125 MG tablet Take 1 tablet (3.125 mg total) by mouth 2 (two) times daily. 08/12/17 11/10/17  End, Cristal Deer, MD  dicyclomine (BENTYL) 20 MG tablet Take 1 tablet (20 mg total) by mouth 3 (three) times daily as needed. 02/21/19   Phineas Semen, MD    Family History Family History  Problem Relation Age of Onset   Arthritis Mother    Hypertension Mother    Diabetes Mother    Arthritis Father    Heart disease Father    Hyperlipidemia Sister    Kidney disease Maternal Aunt    Breast cancer Maternal Aunt    Thyroid cancer Maternal Aunt    Breast cancer Paternal Aunt 38   Prostate cancer Paternal Grandfather    Aneurysm Other    Colon cancer Neg Hx    Stomach cancer Neg Hx    Rectal cancer Neg Hx    Esophageal cancer Neg Hx    Liver cancer Neg Hx     Social History Social  History   Tobacco Use   Smoking status: Never   Smokeless tobacco: Never  Vaping Use   Vaping Use: Never used  Substance Use Topics   Alcohol use: No   Drug use: No     Allergies   Lisinopril, Hydrochlorothiazide, Penicillins, and Amlodipine   Review of Systems Review of Systems  Constitutional:  Negative for chills, diaphoresis, fatigue and fever.  HENT:  Positive for postnasal drip and sore throat. Negative for congestion, ear pain, rhinorrhea, sinus pressure and sinus pain.   Respiratory:  Negative for cough and shortness of breath.   Gastrointestinal:  Negative for abdominal pain, nausea and vomiting.  Musculoskeletal:  Negative for arthralgias and myalgias.  Skin:  Negative for rash.  Neurological:  Negative for weakness and headaches.  Hematological:  Negative for adenopathy.    Physical Exam Triage Vital  Signs ED Triage Vitals  Enc Vitals Group     BP 01/06/21 1201 (!) 177/74     Pulse Rate 01/06/21 1201 92     Resp 01/06/21 1201 16     Temp 01/06/21 1201 98.1 F (36.7 C)     Temp Source 01/06/21 1201 Oral     SpO2 01/06/21 1201 100 %     Weight 01/06/21 1158 269 lb (122 kg)     Height 01/06/21 1158 5\' 2"  (1.575 m)     Head Circumference --      Peak Flow --      Pain Score 01/06/21 1158 6     Pain Loc --      Pain Edu? --      Excl. in GC? --    No data found.  Updated Vital Signs BP (!) 177/74 (BP Location: Left Arm)   Pulse 92   Temp 98.1 F (36.7 C) (Oral)   Resp 16   Ht 5\' 2"  (1.575 m)   Wt 269 lb (122 kg)   LMP  (Approximate)   SpO2 100%   BMI 49.20 kg/m       Physical Exam Vitals and nursing note reviewed.  Constitutional:      General: She is not in acute distress.    Appearance: Normal appearance. She is not ill-appearing or toxic-appearing.  HENT:     Head: Normocephalic and atraumatic.     Nose: Nose normal.     Mouth/Throat:     Mouth: Mucous membranes are moist.     Pharynx: Oropharynx is clear. Posterior oropharyngeal erythema present.     Tonsils: 2+ on the right. 2+ on the left.  Eyes:     General: No scleral icterus.       Right eye: No discharge.        Left eye: No discharge.     Conjunctiva/sclera: Conjunctivae normal.  Cardiovascular:     Rate and Rhythm: Normal rate and regular rhythm.     Heart sounds: Normal heart sounds.  Pulmonary:     Effort: Pulmonary effort is normal. No respiratory distress.     Breath sounds: Normal breath sounds.  Musculoskeletal:     Cervical back: Neck supple.  Skin:    General: Skin is dry.  Neurological:     General: No focal deficit present.     Mental Status: She is alert. Mental status is at baseline.     Motor: No weakness.     Gait: Gait normal.  Psychiatric:        Mood and Affect: Mood normal.  Behavior: Behavior normal.        Thought Content: Thought content normal.     UC  Treatments / Results  Labs (all labs ordered are listed, but only abnormal results are displayed) Labs Reviewed  GROUP A STREP BY PCR  POCT RAPID STREP A, ED / UC  POCT RAPID STREP A    EKG   Radiology No results found.  Procedures Procedures (including critical care time)  Medications Ordered in UC Medications - No data to display  Initial Impression / Assessment and Plan / UC Course  I have reviewed the triage vital signs and the nursing notes.  Pertinent labs & imaging results that were available during my care of the patient were reviewed by me and considered in my medical decision making (see chart for details).  50 year old female presenting for sore throat and postnasal drainage for the past few days.  She is afebrile in clinic today.  Exam is significant for 2+ bilateral tonsillar enlargement with erythema.  Her chest is clear to auscultation heart regular rate and rhythm.  Rapid strep test is negative.  We will send culture.  Treating her tonsillitis at this time with 20 mg prednisone for 5 days and viscous lidocaine as well as Tylenol and cool liquids.  Advised to follow-up as needed for any new or worsening symptoms.  Advised if culture result is positive we will send antibiotics.  Final Clinical Impressions(s) / UC Diagnoses   Final diagnoses:  Acute tonsillitis, unspecified etiology  Sore throat     Discharge Instructions      SORE THROAT: Negative rapid strep test. We have sent a specimen for culture and will call if it is positive in a couple of days. The treatment of sore throat depends upon the cause; strep throat is treated with an antibiotic, while viral pharyngitis is treated with rest, pain relievers. If prescribed, finish the entire course of antibiotics .Increase rest, fluids, and OTC meds as needed . I have sent low dose prednisone for swelling and inflammation and viscous lidocaine for pain. If you have any questions or concerns, please call us or  stop back at any time and we will be happy to help you. If your symptoms worsen, f/u with our office  or go to the ER        ED Prescriptions     Medication Sig Dispense Auth. Provider   predniSONE (DELTASONE) 20 MG tablet Take 1 tablet (20 mg total) by mouth daily for 5 days. 5 tablet Eusebio Friendly B, PA-C   lidocaine (XYLOCAINE) 2 % solution Use as directed 15 mLs in the mouth or throat every 3 (three) hours as needed for up to 7 days for mouth pain (swish and spit). 100 mL Shirlee Latch, PA-C      PDMP not reviewed this encounter.   Shirlee Latch, PA-C 01/06/21 1237

## 2021-01-06 NOTE — ED Triage Notes (Signed)
Patient took covid home test and was negative.

## 2021-01-09 LAB — CULTURE, GROUP A STREP (THRC)

## 2021-02-10 ENCOUNTER — Emergency Department (HOSPITAL_BASED_OUTPATIENT_CLINIC_OR_DEPARTMENT_OTHER)
Admission: EM | Admit: 2021-02-10 | Discharge: 2021-02-10 | Disposition: A | Discharge status: Home / Self Care | Payer: BLUE CROSS/BLUE SHIELD | Attending: Emergency Medicine | Admitting: Emergency Medicine

## 2021-02-10 ENCOUNTER — Other Ambulatory Visit: Payer: Self-pay

## 2021-02-10 ENCOUNTER — Emergency Department (HOSPITAL_BASED_OUTPATIENT_CLINIC_OR_DEPARTMENT_OTHER): Payer: BLUE CROSS/BLUE SHIELD

## 2021-02-10 ENCOUNTER — Encounter (HOSPITAL_BASED_OUTPATIENT_CLINIC_OR_DEPARTMENT_OTHER): Payer: Self-pay | Admitting: Emergency Medicine

## 2021-02-10 DIAGNOSIS — R109 Unspecified abdominal pain: Secondary | ICD-10-CM | POA: Insufficient documentation

## 2021-02-10 DIAGNOSIS — Z79899 Other long term (current) drug therapy: Secondary | ICD-10-CM | POA: Insufficient documentation

## 2021-02-10 DIAGNOSIS — M545 Low back pain, unspecified: Secondary | ICD-10-CM | POA: Diagnosis not present

## 2021-02-10 DIAGNOSIS — Z8616 Personal history of COVID-19: Secondary | ICD-10-CM | POA: Diagnosis not present

## 2021-02-10 DIAGNOSIS — I10 Essential (primary) hypertension: Secondary | ICD-10-CM | POA: Insufficient documentation

## 2021-02-10 DIAGNOSIS — R110 Nausea: Secondary | ICD-10-CM | POA: Diagnosis not present

## 2021-02-10 LAB — COMPREHENSIVE METABOLIC PANEL
ALT: 11 U/L (ref 0–44)
AST: 14 U/L — ABNORMAL LOW (ref 15–41)
Albumin: 3.9 g/dL (ref 3.5–5.0)
Alkaline Phosphatase: 54 U/L (ref 38–126)
Anion gap: 6 (ref 5–15)
BUN: 13 mg/dL (ref 6–20)
CO2: 28 mmol/L (ref 22–32)
Calcium: 8.7 mg/dL — ABNORMAL LOW (ref 8.9–10.3)
Chloride: 103 mmol/L (ref 98–111)
Creatinine, Ser: 0.65 mg/dL (ref 0.44–1.00)
GFR, Estimated: >60 mL/min (ref >60)
Glucose, Bld: 111 mg/dL — ABNORMAL HIGH (ref 70–99)
Potassium: 3.8 mmol/L (ref 3.5–5.1)
Sodium: 137 mmol/L (ref 135–145)
Total Bilirubin: 0.4 mg/dL (ref 0.3–1.2)
Total Protein: 6.8 g/dL (ref 6.5–8.1)

## 2021-02-10 LAB — URINALYSIS, ROUTINE W REFLEX MICROSCOPIC
Bilirubin Urine: NEGATIVE
Glucose, UA: NEGATIVE mg/dL
Hgb urine dipstick: NEGATIVE
Ketones, ur: NEGATIVE mg/dL
Leukocytes,Ua: NEGATIVE
Nitrite: NEGATIVE
Specific Gravity, Urine: 1.036 — ABNORMAL HIGH (ref 1.005–1.030)
pH: 5.5 (ref 5.0–8.0)

## 2021-02-10 LAB — CBC
HCT: 34.6 % — ABNORMAL LOW (ref 36.0–46.0)
Hemoglobin: 11.5 g/dL — ABNORMAL LOW (ref 12.0–15.0)
MCH: 28.5 pg (ref 26.0–34.0)
MCHC: 33.2 g/dL (ref 30.0–36.0)
MCV: 85.9 fL (ref 80.0–100.0)
Platelets: 347 10*3/uL (ref 150–400)
RBC: 4.03 MIL/uL (ref 3.87–5.11)
RDW: 15.9 % — ABNORMAL HIGH (ref 11.5–15.5)
WBC: 5 10*3/uL (ref 4.0–10.5)
nRBC: 0 % (ref 0.0–0.2)

## 2021-02-10 LAB — PREGNANCY, URINE: Preg Test, Ur: NEGATIVE

## 2021-02-10 LAB — LIPASE, BLOOD: Lipase: 12 U/L (ref 11–51)

## 2021-02-10 MED ORDER — IOHEXOL 300 MG/ML  SOLN
100.0000 mL | Freq: Once | INTRAMUSCULAR | Status: AC | PRN
  Administered 2021-02-10: 100 mL via INTRAVENOUS

## 2021-02-10 MED ORDER — METHOCARBAMOL 500 MG PO TABS
500.0000 mg | ORAL_TABLET | Freq: Two times a day (BID) | ORAL | 0 refills | Status: AC | PRN
Start: 2021-02-10 — End: ?

## 2021-02-10 NOTE — ED Notes (Signed)
Patient transported to CT 

## 2021-02-10 NOTE — ED Triage Notes (Signed)
2 weeks right  flank pain, had CT last week which was normal per patient but pain has persisted and worsened. Denies n/v/d/fever/loss of appetite

## 2021-02-10 NOTE — ED Provider Notes (Signed)
[facility] EMERGENCY DEPT Provider Note   CSN: [id number] Arrival date & time: 02/10/21  9379     History Chief Complaint  Patient presents with   Flank Pain    Tara Newman is a 50 y.o. female.  She has history of hypertension, uterine prolapse.  She is complaining of some right flank and low back pain its been going on for a few months intermittently but getting progressively worse.  Sometimes associated with some nausea.  Worse with bending and twisting.  Intermittent spasms.  No radiation down the legs.  No urinary symptoms although urine is darker.  She went to an ER about a week ago had a CT and lab work and no clear answer was identified.  The history is provided by the patient.  Flank Pain This is a new problem. Episode onset: 2 months. The problem occurs daily. The problem has been gradually worsening. Pertinent negatives include no chest pain, no abdominal pain, no headaches and no shortness of breath. The symptoms are aggravated by bending and twisting. Nothing relieves the symptoms. She has tried acetaminophen for the symptoms. The treatment provided no relief.      Past Medical History:  Diagnosis Date   COVID-19 virus infection    Family history of premature CAD    a. father diagnosed with CAD in his 25s   Frequent headaches    Hypertension    Palpitations    a. Lexiscan 11/2016: no sig ischemia, nl wall motion, EF > 55%, low risk scan; b. TTE 11/2016 EF 60-65%, nl wall motion, GR1DD, nl RV sys fxn, PASP nl    Patient Active Problem List   Diagnosis Date Noted   Hyperlipidemia 08/13/2017   Elevated blood pressure reading 05/16/2017   Palpitations 09/25/2016   Dysphagia 09/17/2016   Hypertension 08/25/2016    Past Surgical History:  Procedure Laterality Date   broken ankle       OB History   No obstetric history on file.     Family History  Problem Relation Age of Onset   Arthritis Mother    Hypertension Mother    Diabetes Mother     Arthritis Father    Heart disease Father    Hyperlipidemia Sister    Kidney disease Maternal Aunt    Breast cancer Maternal Aunt    Thyroid cancer Maternal Aunt    Breast cancer Paternal Aunt 39   Prostate cancer Paternal Grandfather    Aneurysm Other    Colon cancer Neg Hx    Stomach cancer Neg Hx    Rectal cancer Neg Hx    Esophageal cancer Neg Hx    Liver cancer Neg Hx     Social History   Tobacco Use   Smoking status: Never   Smokeless tobacco: Never  Vaping Use   Vaping Use: Never used  Substance Use Topics   Alcohol use: No   Drug use: No    Home Medications Prior to Admission medications   Medication Sig Start Date End Date Taking? Authorizing Provider  acetaminophen (TYLENOL) 500 MG tablet Take 500 mg by mouth as needed.   Yes [provider]  losartan (COZAAR) 100 MG tablet Take 100 mg by mouth daily.   Yes [provider]  carvedilol (COREG) 3.125 MG tablet Take 1 tablet (3.125 mg total) by mouth 2 (two) times daily. 08/12/17 11/10/17  End, Cristal Deer, MD  dicyclomine (BENTYL) 20 MG tablet Take 1 tablet (20 mg total) by mouth 3 (three) times  daily as needed. 02/21/19   Phineas Semen, MD  furosemide (LASIX) 40 MG tablet Take 40 mg by mouth daily as needed. 03/02/17 01/06/21  [provider]    Allergies    Lisinopril, Hydrochlorothiazide, Penicillins, and Amlodipine  Review of Systems   Review of Systems  Constitutional:  Negative for fever.  HENT:  Negative for sore throat.   Eyes:  Negative for visual disturbance.  Respiratory:  Negative for shortness of breath.   Cardiovascular:  Negative for chest pain.  Gastrointestinal:  Negative for abdominal pain.  Genitourinary:  Positive for flank pain. Negative for dysuria.  Musculoskeletal:  Positive for back pain.  Skin:  Negative for rash.  Neurological:  Negative for weakness, numbness and headaches.   Physical Exam Updated Vital Signs BP (!) 176/78   Pulse 70   Temp 98.1 F  (36.7 C)   Resp 20   SpO2 100%   Physical Exam Vitals and nursing note reviewed.  Constitutional:      General: She is not in acute distress.    Appearance: Normal appearance. She is well-developed. She is obese.  HENT:     Head: Normocephalic and atraumatic.  Eyes:     Conjunctiva/sclera: Conjunctivae normal.  Cardiovascular:     Rate and Rhythm: Normal rate and regular rhythm.     Heart sounds: No murmur heard. Pulmonary:     Effort: Pulmonary effort is normal. No respiratory distress.     Breath sounds: Normal breath sounds.  Abdominal:     Palpations: Abdomen is soft.     Tenderness: There is no abdominal tenderness. There is no guarding or rebound.  Musculoskeletal:        General: No deformity. Normal range of motion.     Cervical back: Neck supple.  Skin:    General: Skin is warm and dry.  Neurological:     General: No focal deficit present.     Mental Status: She is alert.     Sensory: No sensory deficit.     Motor: No weakness.     Gait: Gait normal.    ED Results / Procedures / Treatments   Labs (all labs ordered are listed, but only abnormal results are displayed) Labs Reviewed  COMPREHENSIVE METABOLIC PANEL - Abnormal; Notable for the following components:      Result Value   Glucose, Bld 111 (*)    Calcium 8.7 (*)    AST 14 (*)    All other components within normal limits  CBC - Abnormal; Notable for the following components:   Hemoglobin 11.5 (*)    HCT 34.6 (*)    RDW 15.9 (*)    All other components within normal limits  URINALYSIS, ROUTINE W REFLEX MICROSCOPIC - Abnormal; Notable for the following components:   Specific Gravity, Urine 1.036 (*)    Protein, ur TRACE (*)    All other components within normal limits  LIPASE, BLOOD  PREGNANCY, URINE    EKG EKG Interpretation  Date/Time:  Sunday February 10 2021 10:03:22 EDT Ventricular Rate:  68 PR Interval:  154 QRS Duration: 97 QT Interval:  400 QTC Calculation: 426 R Axis:   62 Text  Interpretation: Sinus rhythm Borderline repolarization abnormality ST,T nonspecific changes compared with prior 7/20 Confirmed by Meridee Score [phone]) on 02/10/2021 10:11:20 AM  Radiology CT Abdomen Pelvis W Contrast  Result Date: 02/10/2021 CLINICAL DATA:  Abdominal pain. Nonlocalized. RIGHT flank pain 2 weeks prior. EXAM: CT ABDOMEN AND PELVIS WITH CONTRAST TECHNIQUE: Multidetector CT  imaging of the abdomen and pelvis was performed using the standard protocol following bolus administration of intravenous contrast. CONTRAST:  OMNIPAQUE IOHEXOL 300 MG/ML  SOLN COMPARISON:  None. FINDINGS: Lower chest: Lung bases are clear. Hepatobiliary: No focal hepatic lesion. No biliary duct dilatation. Common bile duct is normal. Pancreas: Pancreas is normal. No ductal dilatation. No pancreatic inflammation. Spleen: Normal spleen Adrenals/urinary tract: Adrenal glands and kidneys are normal. The ureters and bladder normal. Stomach/Bowel: Stomach, small bowel, appendix, and cecum are normal. Multiple diverticula of the descending colon and sigmoid colon without acute inflammation. Vascular/Lymphatic: Abdominal aorta is normal caliber. No periportal or retroperitoneal adenopathy. No pelvic adenopathy. Reproductive: Uterus and adnexa unremarkable. Other: No inguinal hernia.  Small fat filled umbilical hernia. Musculoskeletal: No aggressive osseous lesion. IMPRESSION: 1. No acute findings in the abdomen pelvis. 2. Normal gallbladder and appendix. 3. No obstructive uropathy. Electronically Signed   By: Genevive Bi M.D.   On: 02/10/2021 12:18    Procedures Procedures   Medications Ordered in ED Medications - No data to display  ED Course  I have reviewed the triage vital signs and the nursing notes.  Pertinent labs & imaging results that were available during my care of the patient were reviewed by me and considered in my medical decision making (see chart for details).  Clinical Course as of 02/10/21  1643  Sun Feb 10, 2021  1234 Reviewed results of testing with patient.  We will trial her on a muscle relaxant.  Counseled her regarding not driving while taking them.  Recommended close follow-up with her primary care doctor and consideration for GI referral or HIDA scan. [MB]    Clinical Course User Index [MB] Terrilee Files, MD   MDM Rules/Calculators/A&P                         This patient complains of right flank and low back pain; this involves an extensive number of treatment Options and is a complaint that carries with it a high risk of complications and Morbidity. The differential includes musculoskeletal pain, biliary dyskinesia, cholelithiasis, cholecystitis, pyelonephritis, renal stone  I ordered, reviewed and interpreted labs, which included CBC with normal white count, hemoglobin low but stable from priors, chemistries and LFTs fairly unremarkable, pregnancy test negative, urinalysis without clear signs of infection I ordered imaging studies which included CT abdomen pelvis and I independently    visualized and interpreted imaging which showed no acute findings to explain patient's symptoms Previous records obtained and reviewed in epic including prior ED visit last week  After the interventions stated above, I reevaluated the patient and found patient still to be uncomfortable.  No clear etiology identified for her symptoms.  Will start her on muscle relaxant.  Recommended close follow-up with PCP as she may benefit from a HIDA scan.  Return instructions discussed   Final Clinical Impression(s) / ED Diagnoses Final diagnoses:  Right flank pain    Rx / DC Orders ED Discharge Orders          Ordered    methocarbamol (ROBAXIN) 500 MG tablet  2 times daily PRN        02/10/21 1235             Terrilee Files, MD 02/10/21 1645

## 2021-02-10 NOTE — Discharge Instructions (Addendum)
You are seen in the emergency department for continued right flank pain.  You had a urinalysis blood work and a CT abdomen and pelvis that did not show an obvious explanation for your pain.  This may be related to muscle spasms and we are prescribing you a muscle relaxant.  Please continue the ibuprofen.  Also follow-up with your primary care doctor for discussion of a HIDA scan.  Return to the emergency department if any high fevers or worsening symptoms.

## 2024-01-31 ENCOUNTER — Emergency Department

## 2024-01-31 ENCOUNTER — Emergency Department
Admission: EM | Admit: 2024-01-31 | Discharge: 2024-01-31 | Disposition: A | Discharge status: Home / Self Care | Attending: Emergency Medicine | Admitting: Emergency Medicine

## 2024-01-31 ENCOUNTER — Other Ambulatory Visit: Payer: Self-pay

## 2024-01-31 DIAGNOSIS — I10 Essential (primary) hypertension: Secondary | ICD-10-CM | POA: Diagnosis not present

## 2024-01-31 DIAGNOSIS — R600 Localized edema: Secondary | ICD-10-CM | POA: Diagnosis not present

## 2024-01-31 DIAGNOSIS — R2243 Localized swelling, mass and lump, lower limb, bilateral: Secondary | ICD-10-CM | POA: Diagnosis present

## 2024-01-31 LAB — BASIC METABOLIC PANEL WITH GFR
Anion gap: 10 (ref 5–15)
BUN: 15 mg/dL (ref 6–20)
CO2: 27 mmol/L (ref 22–32)
Calcium: 9.3 mg/dL (ref 8.9–10.3)
Chloride: 103 mmol/L (ref 98–111)
Creatinine, Ser: 0.71 mg/dL (ref 0.44–1.00)
GFR, Estimated: >60 mL/min (ref >60)
Glucose, Bld: 91 mg/dL (ref 70–99)
Potassium: 3.9 mmol/L (ref 3.5–5.1)
Sodium: 140 mmol/L (ref 135–145)

## 2024-01-31 LAB — TROPONIN I (HIGH SENSITIVITY)
Troponin I (High Sensitivity): 3 ng/L (ref <18)
Troponin I (High Sensitivity): 4 ng/L (ref <18)

## 2024-01-31 LAB — HEPATIC FUNCTION PANEL
ALT: 14 U/L (ref 0–44)
AST: 16 U/L (ref 15–41)
Albumin: 3.8 g/dL (ref 3.5–5.0)
Alkaline Phosphatase: 64 U/L (ref 38–126)
Bilirubin, Direct: <0.1 mg/dL (ref 0.0–0.2)
Total Bilirubin: 0.6 mg/dL (ref 0.0–1.2)
Total Protein: 7.3 g/dL (ref 6.5–8.1)

## 2024-01-31 LAB — CBC
HCT: 38.2 % (ref 36.0–46.0)
Hemoglobin: 12.5 g/dL (ref 12.0–15.0)
MCH: 27.8 pg (ref 26.0–34.0)
MCHC: 32.7 g/dL (ref 30.0–36.0)
MCV: 85.1 fL (ref 80.0–100.0)
Platelets: 351 K/uL (ref 150–400)
RBC: 4.49 MIL/uL (ref 3.87–5.11)
RDW: 15.9 % — ABNORMAL HIGH (ref 11.5–15.5)
WBC: 5.7 K/uL (ref 4.0–10.5)
nRBC: 0 % (ref 0.0–0.2)

## 2024-01-31 LAB — BRAIN NATRIURETIC PEPTIDE: B Natriuretic Peptide: 18.7 pg/mL (ref 0.0–100.0)

## 2024-01-31 NOTE — ED Notes (Signed)
 Lab called to come stick for second trop as Pt hard stick for this RN

## 2024-01-31 NOTE — ED Notes (Signed)
 ER Doc notified of BP of 176/111

## 2024-01-31 NOTE — ED Notes (Signed)
 Pt states she is on lasix and HTN medication, she's been taking her HTN medications but not so good w/Lasix. It's been a few days.

## 2024-01-31 NOTE — ED Triage Notes (Signed)
 Pt comes with bilateral feet swelling. Pt also has knot on right foot.   Pt states discomfort in chest. Pt states stomach pain little and feels little swimmy headed.

## 2024-01-31 NOTE — ED Notes (Signed)
 Urine sent with save tube label

## 2024-01-31 NOTE — ED Provider Notes (Signed)
 [facility] Provider Note    Event Date/Time   First MD Initiated Contact with Patient 01/31/24 1232     (approximate)   History   Foot Swelling   HPI  Tara Newman is a 53 y.o. female who presents to the emergency department today because of concerns for bilateral leg swelling and then a knot on her right foot.  She has had issues with leg swelling for a little while.  Typically it will get better.  She had some swelling yesterday however when it started to go down noticed a knot on the top of her right foot.  She denies any significant pain there.  Is concerned for possible blood clot.  Otherwise patient denies any fevers.  No shortness of breath.  Does have a history of high blood pressure and took her medication this morning except her fluid pill which she usually takes later in the day.     Physical Exam   Triage Vital Signs: ED Triage Vitals  Encounter Vitals Group     BP 01/31/24 1147 (!) 156/86     Girls Systolic BP Percentile --      Girls Diastolic BP Percentile --      Boys Systolic BP Percentile --      Boys Diastolic BP Percentile --      Pulse Rate 01/31/24 1147 81     Resp 01/31/24 1147 18     Temp 01/31/24 1147 98.3 F (36.8 C)     Temp src --      SpO2 01/31/24 1147 96 %     Weight 01/31/24 1145 280 lb (127 kg)     Height 01/31/24 1145 5' 3 (1.6 m)     Head Circumference --      Peak Flow --      Pain Score 01/31/24 1145 4     Pain Loc --      Pain Education --      Exclude from Growth Chart --     Most recent vital signs: Vitals:   01/31/24 1147  BP: (!) 156/86  Pulse: 81  Resp: 18  Temp: 98.3 F (36.8 C)  SpO2: 96%   General: Awake, alert, oriented. CV:  Good peripheral perfusion. Regular rate and rhythm. Resp:  Normal effort. Lungs clear. Abd:  No distention.  Other:  Bilateral lower extremity edema - trace. No discomfort. Small area of swelling to the distal dorsum of the right foot - non tender, no  erythema.    ED Results / Procedures / Treatments   Labs (all labs ordered are listed, but only abnormal results are displayed) Labs Reviewed  CBC - Abnormal; Notable for the following components:      Result Value   RDW 15.9 (*)    All other components within normal limits  BASIC METABOLIC PANEL WITH GFR  HEPATIC FUNCTION PANEL  BRAIN NATRIURETIC PEPTIDE  TROPONIN I (HIGH SENSITIVITY)  TROPONIN I (HIGH SENSITIVITY)     EKG  I, Guadalupe Eagles, attending physician, personally viewed and interpreted this EKG  EKG Time: 1150 Rate: 78 Rhythm: normal sinus rhythm Axis: normal Intervals: qtc 403 QRS: narrow ST changes: no st elevation Impression: normal ekg   RADIOLOGY I independently interpreted and visualized the CXR. My interpretation: No pneumonia Radiology interpretation:  IMPRESSION:  No acute cardiopulmonary disease.      PROCEDURES:  Critical Care performed: No   MEDICATIONS ORDERED IN ED: Medications - No data to display  IMPRESSION / MDM / ASSESSMENT AND PLAN / ED COURSE  I reviewed the triage vital signs and the nursing notes.                              Differential diagnosis includes, but is not limited to, heart failure, kidney failure, liver failure, venous insufficiency   Patient's presentation is most consistent with acute presentation with potential threat to life or bodily function.  Patient presented to the emergency department today because of concerns for bilateral lower extremity edema.  On exam patient does have very mild edema to bilateral lower extremities.  The patient's knot on the top of her right foot is nontender, not erythematous.  Have low concern for abscess or blood clot at this time.  Blood work without elevation of BNP, creatinine or liver enzymes.  At this time I think venous insufficiency likely.  I discussed this with the patient.  Do think is reasonable for patient be discharged.  Recommended patient try compression  stockings and follow-up with primary care.   FINAL CLINICAL IMPRESSION(S) / ED DIAGNOSES   Final diagnoses:  Lower extremity edema       Note:  This document was prepared using Dragon voice recognition software and may include unintentional dictation errors.    Floy Roberts, MD 01/31/24 [phone]

## 2024-01-31 NOTE — Discharge Instructions (Addendum)
 As we discussed please use compression stockings to help with the swelling.

## 2024-04-13 DIAGNOSIS — Z1211 Encounter for screening for malignant neoplasm of colon: Secondary | ICD-10-CM | POA: Diagnosis not present

## 2024-04-13 DIAGNOSIS — Z2821 Immunization not carried out because of patient refusal: Secondary | ICD-10-CM | POA: Diagnosis not present

## 2024-04-13 DIAGNOSIS — M7989 Other specified soft tissue disorders: Secondary | ICD-10-CM | POA: Diagnosis not present

## 2024-04-13 DIAGNOSIS — E7849 Other hyperlipidemia: Secondary | ICD-10-CM | POA: Diagnosis not present

## 2024-04-13 DIAGNOSIS — Z1231 Encounter for screening mammogram for malignant neoplasm of breast: Secondary | ICD-10-CM | POA: Diagnosis not present

## 2024-04-13 DIAGNOSIS — Z6841 Body Mass Index (BMI) 40.0 and over, adult: Secondary | ICD-10-CM | POA: Diagnosis not present

## 2024-04-13 DIAGNOSIS — I10 Essential (primary) hypertension: Secondary | ICD-10-CM | POA: Diagnosis not present

## 2024-04-13 DIAGNOSIS — E66813 Obesity, class 3: Secondary | ICD-10-CM | POA: Diagnosis not present

## 2024-05-23 DIAGNOSIS — E66813 Obesity, class 3: Secondary | ICD-10-CM | POA: Diagnosis not present

## 2024-05-23 DIAGNOSIS — E7849 Other hyperlipidemia: Secondary | ICD-10-CM | POA: Diagnosis not present

## 2024-05-23 DIAGNOSIS — I10 Essential (primary) hypertension: Secondary | ICD-10-CM | POA: Diagnosis not present

## 2024-05-23 DIAGNOSIS — Z6841 Body Mass Index (BMI) 40.0 and over, adult: Secondary | ICD-10-CM | POA: Diagnosis not present

## 2024-05-23 DIAGNOSIS — Z1211 Encounter for screening for malignant neoplasm of colon: Secondary | ICD-10-CM | POA: Diagnosis not present

## 2024-07-11 ENCOUNTER — Encounter: Payer: Self-pay | Admitting: Family Medicine

## 2024-07-11 ENCOUNTER — Ambulatory Visit (INDEPENDENT_AMBULATORY_CARE_PROVIDER_SITE_OTHER): Admitting: Family Medicine

## 2024-07-11 VITALS — BP 144/100 | HR 65 | Ht 63.0 in | Wt 285.8 lb

## 2024-07-11 DIAGNOSIS — E559 Vitamin D deficiency, unspecified: Secondary | ICD-10-CM | POA: Diagnosis not present

## 2024-07-11 DIAGNOSIS — G4733 Obstructive sleep apnea (adult) (pediatric): Secondary | ICD-10-CM

## 2024-07-11 DIAGNOSIS — R600 Localized edema: Secondary | ICD-10-CM

## 2024-07-11 DIAGNOSIS — E66813 Obesity, class 3: Secondary | ICD-10-CM | POA: Diagnosis not present

## 2024-07-11 DIAGNOSIS — I10 Essential (primary) hypertension: Secondary | ICD-10-CM

## 2024-07-11 DIAGNOSIS — R7303 Prediabetes: Secondary | ICD-10-CM | POA: Diagnosis not present

## 2024-07-11 DIAGNOSIS — E782 Mixed hyperlipidemia: Secondary | ICD-10-CM | POA: Diagnosis not present

## 2024-07-11 DIAGNOSIS — Z6841 Body Mass Index (BMI) 40.0 and over, adult: Secondary | ICD-10-CM

## 2024-07-11 DIAGNOSIS — R7989 Other specified abnormal findings of blood chemistry: Secondary | ICD-10-CM

## 2024-07-11 LAB — COMPREHENSIVE METABOLIC PANEL WITH GFR
ALT: 15 U/L (ref 0–35)
AST: 13 U/L (ref 0–37)
Albumin: 4.1 g/dL (ref 3.5–5.2)
Alkaline Phosphatase: 76 U/L (ref 39–117)
BUN: 14 mg/dL (ref 6–23)
CO2: 30 meq/L (ref 19–32)
Calcium: 9.2 mg/dL (ref 8.4–10.5)
Chloride: 100 meq/L (ref 96–112)
Creatinine, Ser: 0.71 mg/dL (ref 0.40–1.20)
GFR: 96.53 mL/min (ref >60.00)
Glucose, Bld: 80 mg/dL (ref 70–99)
Potassium: 3.9 meq/L (ref 3.5–5.1)
Sodium: 136 meq/L (ref 135–145)
Total Bilirubin: 0.5 mg/dL (ref 0.2–1.2)
Total Protein: 7.4 g/dL (ref 6.0–8.3)

## 2024-07-11 LAB — TSH: TSH: 2.05 u[IU]/mL (ref 0.35–5.50)

## 2024-07-11 LAB — VITAMIN D 25 HYDROXY (VIT D DEFICIENCY, FRACTURES): VITD: <7 ng/mL — ABNORMAL LOW (ref 30.00–100.00)

## 2024-07-11 LAB — T4, FREE: Free T4: 0.88 ng/dL (ref 0.60–1.60)

## 2024-07-11 MED ORDER — TORSEMIDE 20 MG PO TABS: 20.0000 mg | ORAL_TABLET | Freq: Every day | ORAL | 3 refills | Status: AC

## 2024-07-11 MED ORDER — ZEPBOUND 2.5 MG/0.5ML ~~LOC~~ SOAJ: 2.5000 mg | SUBCUTANEOUS | 1 refills | Status: AC

## 2024-07-11 NOTE — Progress Notes (Signed)
 Diagnoses and Orders:   1. Prediabetes, highest A1c 6.1 in chart   2. Essential hypertension   3. Mixed hyperlipidemia   4. Elevated TSH   5. OSA (obstructive sleep apnea), mod, dx 2019   6. Vitamin D  deficiency   7. Bilateral lower extremity edema   8. Class 3 severe obesity with serious comorbidity and body mass index (BMI) of 50.0 to 59.9 in adult, unspecified obesity type (HCC)    Meds ordered this encounter  Medications   torsemide  (DEMADEX ) 20 MG tablet    Sig: Take 1 tablet (20 mg total) by mouth daily.    Dispense:  30 tablet    Refill:  3   tirzepatide  (ZEPBOUND ) 2.5 MG/0.5ML Pen    Sig: Inject 2.5 mg into the skin once a week.    Dispense:  2 mL    Refill:  1   Orders Placed This Encounter  Procedures   Comprehensive metabolic panel with GFR   Insulin , random   TSH   T4, free   VITAMIN D  25 Hydroxy (Vit-D Deficiency, Fractures)   Assessment & Plan:   Assessment and Plan Assessment & Plan Metabolic syndrome Elevated A1c at 5.7% indicates prediabetes. ASCVD risk score of 9% suggests increased cardiovascular risk. Family history of hyperlipidemia and diabetes. Non-adherence to medication due to memory issues and fear of overmedication. - Initiated Zepbound  for weight loss, potential coverage under sleep apnea diagnosis. - Increased furosemide to torsemide  for better fluid management. - Continue losartan until blood pressure is better controlled. - Ordered additional labs including thyroid  function tests. - Schedule follow-up in a few weeks.  Obstructive sleep apnea Diagnosed in 2019, moderate severity. Not using CPAP. Likely contributing to elevated blood pressure and leg swelling. - Discussed potential benefits of CPAP therapy.  Abnormal TSH Previous thyroid  labs were abnormal. Symptoms suggestive of thyroid  dysfunction. - Ordered thyroid  function tests.  General health maintenance Has not undergone colonoscopy but used Cologuard for colorectal cancer  screening. Reports stress and lifestyle factors impacting health. - Encouraged regular health screenings and lifestyle modifications to reduce stress.  Geni Shutter, DO, MS, FAAFP, Dipl. [facility] [address] [place], [address] Dept: [phone] Dept Fax: [phone]  Subjective:   History of Present Illness Tara Newman is a 53 year old female with hypertension and sleep apnea who presents with concerns about swelling and memory issues post-COVID.  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 - History of COVID-19 infection with three weeks of severe diarrhea - Persistent memory fog since infection - Difficulty remembering if medications have been taken, impacting ability to keep up with schooling - Uses sticky notes as reminders around the house  Hypertension and blood glucose concerns - Hypertension treated with losartan - Concerned about blood pressure control - Concerned about blood sugar progression - Hemoglobin A1c of 5.7 - Strong family history of diabetes  Uterine prolapse - Prolapsed uterus causing significant discomfort, especially with prolonged standing - Has not pursued surgical intervention due to fear  Sleep disturbance and sleep apnea - Diagnosed with moderate sleep apnea in 2019 - Does not use CPAP therapy - Poor sleep quality, often falls asleep in a chair  and wakes in the early morning - Nonrestorative sleep  Review of Systems: Negative, with the exception of above mentioned in HPI.  Medications:   Show/hide medication list[1]  Objective:   BP (!) 144/100  (BP Location: Right Arm, Cuff Size: Large)   Pulse 65   Ht 5' 3 (1.6 m)   Wt 285 lb 12.8 oz (129.6 kg)   LMP 03/01/2024 (Approximate)   SpO2 100%   BMI 50.63 kg/m   Physical Exam Constitutional:      General: She is not in acute distress.    Appearance: She is well-developed.  HENT:     Head: Normocephalic and atraumatic.  Eyes:     Conjunctiva/sclera: Conjunctivae normal.  Cardiovascular:     Rate and Rhythm: Normal rate and regular rhythm.     Heart sounds: Normal heart sounds.  Pulmonary:     Effort: Pulmonary effort is normal.     Breath sounds: Normal breath sounds.  Neurological:     General: No focal deficit present.     Mental Status: She is alert.  Psychiatric:        Behavior: Behavior normal.    Attestations:   Patient is establishing care in this system with me as PCP., Available records reviewed., Chart updated today with reconciliation of problem list, medications, allergies, and relevant history. Preventive care and chronic disease status reviewed. , Portions of historical chart may remain incomplete; will update on an ongoing basis as clinically indicated. , and Discussed the use of AI scribe software for clinical note transcription with the patient, who gave verbal consent to proceed.     [1]  Outpatient Medications Prior to Visit  Medication Sig   losartan (COZAAR) 100 MG tablet Take 100 mg by mouth daily.   [DISCONTINUED] furosemide (LASIX) 40 MG tablet Take 40 mg by mouth daily as needed.   [DISCONTINUED] methocarbamol  (ROBAXIN ) 500 MG tablet Take 1 tablet (500 mg total) by mouth 2 (two) times daily as needed for muscle spasms.   [DISCONTINUED] acetaminophen  (TYLENOL ) 500 MG tablet Take 500 mg by mouth as needed.   [DISCONTINUED] carvedilol  (COREG ) 3.125 MG tablet Take 1 tablet (3.125 mg total) by mouth 2 (two) times daily.   [DISCONTINUED] dicyclomine  (BENTYL ) 20 MG tablet Take 1 tablet (20 mg total) by mouth 3 (three) times daily as needed.   No  facility-administered medications prior to visit.

## 2024-07-12 LAB — INSULIN, RANDOM: Insulin: 13.5 u[IU]/mL

## 2024-07-13 ENCOUNTER — Other Ambulatory Visit (HOSPITAL_COMMUNITY): Payer: Self-pay

## 2024-07-15 ENCOUNTER — Telehealth: Payer: Self-pay

## 2024-07-15 ENCOUNTER — Other Ambulatory Visit (HOSPITAL_COMMUNITY): Payer: Self-pay

## 2024-07-15 NOTE — Telephone Encounter (Signed)
 Pharmacy Patient Advocate Encounter   Received notification from Onbase that prior authorization for Zepbound  2.5MG /0.5ML pen-injectors  is required/requested.   Insurance verification completed.   The patient is insured through [facility] MEDICAID.   Per test claim: PA required; PA submitted to above mentioned insurance via Latent Key/confirmation #/EOC [id number] Status is pending

## 2024-07-18 NOTE — Telephone Encounter (Signed)
 Pharmacy Patient Advocate Encounter  Received notification from [facility] MEDICAID that Prior Authorization for  Zepbound  2.5MG /0.5ML pen-injectors  has been DENIED.  See denial reason below. No denial letter attached in CMM. Will attach denial letter to Media tab once received.   PA #/Case ID/Reference #: [id number]

## 2024-08-02 ENCOUNTER — Ambulatory Visit: Payer: Self-pay | Admitting: Family Medicine

## 2024-08-02 DIAGNOSIS — E559 Vitamin D deficiency, unspecified: Secondary | ICD-10-CM

## 2024-08-02 MED ORDER — VITAMIN D (ERGOCALCIFEROL) 1.25 MG (50000 UNIT) PO CAPS: 50000.0000 [IU] | ORAL_CAPSULE | ORAL | 0 refills | Status: AC

## 2024-08-02 NOTE — Telephone Encounter (Signed)
 Message sent to pt. Rx sent to pts pharmacy

## 2024-08-09 ENCOUNTER — Ambulatory Visit: Admitting: Family Medicine

## 2024-08-09 NOTE — Progress Notes (Unsigned)
 "    Patient Care Team: Prentiss Frieze, DO as PCP - General (Family Medicine)  Weight Management:   Starting weight: *** Starting date: *** Today's weight: *** Today's date: 08/09/2024 Total lbs lost to date: *** Total lbs lost since last in-office visit: *** Total weight loss percentage to date: -***% There is no height or weight on file to calculate BMI.   Resting Metabolic Rate: *** Nutrition Plan: {EW NUTRITION HNJOD:65522}. Anti-obesity medications (including off-label): ***. Reported side effects: ***. Hunger is {EWCONTROLASSESSMENT:24261}. Cravings are {EWCONTROLASSESSMENT:24261}. Activity: {MWM EXERCISE RECS:23473}. Sleep: Number of hours slept each night: ***. Sleep {ACTION; IS/IS NOT:21021397} restful.   Diagnoses and Orders:   No diagnosis found. No orders of the defined types were placed in this encounter.  No orders of the defined types were placed in this encounter.  Assessment & Plan:   Assessment and Plan Assessment & Plan     Frieze Prentiss, DO, MS, FAAFP, Dipl. [facility] [address] [place], [address] Dept: [phone] Dept Fax: [phone]  Subjective:   History of Present Illness   Review of Systems: Negative, with the exception of above mentioned in HPI.  History:   Reviewed by clinician on day of visit: allergies, medications, problem list, medical history, surgical history, family history, social history, and previous encounter notes.  Medications:   Show/hide medication list[1] Allergies[2]  Objective:   LMP 03/01/2024 (Approximate)  {Insert last BP/Wt (optional):23777}{See vitals history (optional):1}   Physical Exam {Insert previous labs (optional):23779} {See past labs  Heme  Chem  Endocrine  Serology  Results Review (optional):1}  Results for orders placed or performed in visit on 07/11/24  Comprehensive metabolic panel with GFR   Collection Time: 07/11/24  11:07 AM  Result Value Ref Range   Sodium 136 135 - 145 mEq/L   Potassium 3.9 3.5 - 5.1 mEq/L   Chloride 100 96 - 112 mEq/L   CO2 30 19 - 32 mEq/L   Glucose, Bld 80 70 - 99 mg/dL   BUN 14 6 - 23 mg/dL   Creatinine, Ser 9.28 0.40 - 1.20 mg/dL   Total Bilirubin 0.5 0.2 - 1.2 mg/dL   Alkaline Phosphatase 76 39 - 117 U/L   AST 13 0 - 37 U/L   ALT 15 0 - 35 U/L   Total Protein 7.4 6.0 - 8.3 g/dL   Albumin 4.1 3.5 - 5.2 g/dL   GFR 03.46 >39.99 mL/min   Calcium 9.2 8.4 - 10.5 mg/dL  Insulin , random   Collection Time: 07/11/24 11:07 AM  Result Value Ref Range   Insulin  13.5 uIU/mL  TSH   Collection Time: 07/11/24 11:07 AM  Result Value Ref Range   TSH 2.05 0.35 - 5.50 uIU/mL  T4, free   Collection Time: 07/11/24 11:07 AM  Result Value Ref Range   Free T4 0.88 0.60 - 1.60 ng/dL  VITAMIN D  25 Hydroxy (Vit-D Deficiency, Fractures)   Collection Time: 07/11/24 11:07 AM  Result Value Ref Range   VITD <7.00 (L) 30.00 - 100.00 ng/mL    08/09/2024    PHQ2-9 Depression Screening   Little interest or pleasure in doing things    Feeling down, depressed, or hopeless    PHQ-2 - Total Score    Trouble falling or staying asleep, or sleeping too much    Feeling tired or having little energy    Poor appetite or overeating     Feeling bad about yourself - or that you are  a failure or have let yourself or your family down    Trouble concentrating on things, such as reading the newspaper or watching television    Moving or speaking so slowly that other people could have noticed.  Or the opposite - being so fidgety or restless that you have been moving around a lot more than usual    Thoughts that you would be better off dead, or hurting yourself in some way    PHQ2-9 Total Score    If you checked off any problems, how difficult have these problems made it for you to do your work, take care of things at home, or get along with other people    Depression Interventions/Treatment         07/11/2024    11:10 AM  GAD 7 : Generalized Anxiety Score  Nervous, Anxious, on Edge 0  Control/stop worrying 2  Worry too much - different things 2  Trouble relaxing 2  Restless 0  Easily annoyed or irritable 1  Afraid - awful might happen 0  Total GAD 7 Score 7  Anxiety Difficulty Not difficult at all   Attestations:   {EW ATTESTATIONS:34266}  Geni Shutter, DO, MS, FAAFP, Dipl. [facility] [address] [place], [address] Dept: [phone] Dept Fax: [phone]    [1]  Outpatient Medications Prior to Visit  Medication Sig   losartan (COZAAR) 100 MG tablet Take 100 mg by mouth daily.   tirzepatide  (ZEPBOUND ) 2.5 MG/0.5ML Pen Inject 2.5 mg into the skin once a week.   torsemide  (DEMADEX ) 20 MG tablet Take 1 tablet (20 mg total) by mouth daily.   Vitamin D , Ergocalciferol , (DRISDOL ) 1.25 MG (50000 UNIT) CAPS capsule Take 1 capsule (50,000 Units total) by mouth every 7 (seven) days.   No facility-administered medications prior to visit.  [2]  Allergies Allergen Reactions   Lisinopril  Swelling   Hydrochlorothiazide     Penicillins Other (See Comments)   Amlodipine  Palpitations   "
